# Patient Record
Sex: Male | Born: 1980 | Race: White | Hispanic: No | Marital: Married | Smoking: Former smoker
Health system: Southern US, Community
[De-identification: ages and names within clinical notes are randomized; demographics above are authoritative.]

## PROBLEM LIST (undated history)

## (undated) DIAGNOSIS — K219 Gastro-esophageal reflux disease without esophagitis: Secondary | ICD-10-CM

## (undated) DIAGNOSIS — Z889 Allergy status to unspecified drugs, medicaments and biological substances status: Secondary | ICD-10-CM

## (undated) DIAGNOSIS — M1990 Unspecified osteoarthritis, unspecified site: Secondary | ICD-10-CM

## (undated) DIAGNOSIS — F419 Anxiety disorder, unspecified: Secondary | ICD-10-CM

## (undated) HISTORY — PX: APPENDECTOMY: SHX54

## (undated) HISTORY — PX: VASECTOMY: SHX75

## (undated) HISTORY — PX: TONSILLECTOMY: SUR1361

## (undated) HISTORY — PX: WISDOM TOOTH EXTRACTION: SHX21

---

## 2011-09-27 ENCOUNTER — Ambulatory Visit (INDEPENDENT_AMBULATORY_CARE_PROVIDER_SITE_OTHER): Payer: BC Managed Care – PPO | Admitting: Urology

## 2011-09-27 DIAGNOSIS — Z3009 Encounter for other general counseling and advice on contraception: Secondary | ICD-10-CM

## 2015-09-09 ENCOUNTER — Encounter (HOSPITAL_COMMUNITY): Payer: Self-pay

## 2015-09-09 ENCOUNTER — Encounter (HOSPITAL_COMMUNITY)
Admission: RE | Admit: 2015-09-09 | Discharge: 2015-09-09 | Disposition: A | Discharge status: Home / Self Care | Payer: BLUE CROSS/BLUE SHIELD | Source: Ambulatory Visit | Attending: Orthopedic Surgery | Admitting: Orthopedic Surgery

## 2015-09-09 DIAGNOSIS — Z0183 Encounter for blood typing: Secondary | ICD-10-CM | POA: Diagnosis not present

## 2015-09-09 DIAGNOSIS — M1611 Unilateral primary osteoarthritis, right hip: Secondary | ICD-10-CM | POA: Diagnosis not present

## 2015-09-09 DIAGNOSIS — Z01812 Encounter for preprocedural laboratory examination: Secondary | ICD-10-CM | POA: Diagnosis present

## 2015-09-09 HISTORY — DX: Rider (driver) (passenger) of other motorcycle injured in unspecified traffic accident, initial encounter: V29.99XA

## 2015-09-09 HISTORY — DX: Unspecified osteoarthritis, unspecified site: M19.90

## 2015-09-09 HISTORY — DX: Allergy status to unspecified drugs, medicaments and biological substances: Z88.9

## 2015-09-09 HISTORY — DX: Gastro-esophageal reflux disease without esophagitis: K21.9

## 2015-09-09 HISTORY — DX: Anxiety disorder, unspecified: F41.9

## 2015-09-09 LAB — CBC
HEMATOCRIT: 46.8 % (ref 39.0–52.0)
HEMOGLOBIN: 15.5 g/dL (ref 13.0–17.0)
MCH: 30.9 pg (ref 26.0–34.0)
MCHC: 33.1 g/dL (ref 30.0–36.0)
MCV: 93.2 fL (ref 78.0–100.0)
Platelets: 183 10*3/uL (ref 150–400)
RBC: 5.02 MIL/uL (ref 4.22–5.81)
RDW: 13.1 % (ref 11.5–15.5)
WBC: 7.3 10*3/uL (ref 4.0–10.5)

## 2015-09-09 LAB — URINALYSIS, ROUTINE W REFLEX MICROSCOPIC
BILIRUBIN URINE: NEGATIVE
GLUCOSE, UA: NEGATIVE mg/dL
Hgb urine dipstick: NEGATIVE
KETONES UR: NEGATIVE mg/dL
LEUKOCYTES UA: NEGATIVE
Nitrite: NEGATIVE
PH: 7 (ref 5.0–8.0)
PROTEIN: NEGATIVE mg/dL
Specific Gravity, Urine: 1.014 (ref 1.005–1.030)

## 2015-09-09 LAB — BASIC METABOLIC PANEL
ANION GAP: 10 (ref 5–15)
BUN: 16 mg/dL (ref 6–20)
CALCIUM: 9.3 mg/dL (ref 8.9–10.3)
CO2: 25 mmol/L (ref 22–32)
Chloride: 105 mmol/L (ref 101–111)
Creatinine, Ser: 0.89 mg/dL (ref 0.61–1.24)
GFR calc non Af Amer: >60 mL/min (ref >60)
GLUCOSE: 105 mg/dL — AB (ref 65–99)
POTASSIUM: 4 mmol/L (ref 3.5–5.1)
Sodium: 140 mmol/L (ref 135–145)

## 2015-09-09 LAB — ABO/RH: ABO/RH(D): A POS

## 2015-09-09 LAB — PROTIME-INR
INR: 1.02 (ref 0.00–1.49)
PROTHROMBIN TIME: 13.2 s (ref 11.6–15.2)

## 2015-09-09 LAB — SURGICAL PCR SCREEN
MRSA, PCR: NEGATIVE
STAPHYLOCOCCUS AUREUS: NEGATIVE

## 2015-09-09 LAB — APTT: aPTT: 29 seconds (ref 24–37)

## 2015-09-09 NOTE — Pre-Procedure Instructions (Addendum)
Cervical , lumbar spine, right shoulder xrays- 12'16 report with chart.

## 2015-09-09 NOTE — Patient Instructions (Signed)
Kenneth Holt  09/09/2015   Your procedure is scheduled on: 09-15-15  [facility]  [facility] 3rd floor to  Short Stay Center at   11:15 AM.  Call this number if you have problems the morning of surgery [phone]   Remember: ONLY 1 PERSON MAY GO WITH YOU TO SHORT STAY TO GET  READY MORNING OF YOUR SURGERY.  Do not eat food or drink liquids :After Midnight. Exception may have Clear Liquids 12 midnight to 0800 AM, then nothing.     Take these medicines the morning of surgery with A SIP OF WATER: Lexapro.Omeprazole. Acyclovir. Clonazepam-if need. Oxycodone- if need. DO NOT TAKE ANY DIABETIC MEDICATIONS DAY OF YOUR SURGERY                               You may not have any metal on your body including hair pins and              piercings  Do not wear jewelry, make-up, lotions, powders or perfumes, deodorant             Do not wear nail polish.  Do not shave  48 hours prior to surgery.              Men may shave face and neck.   Do not bring valuables to the hospital. [facility] IS NOT             RESPONSIBLE   FOR VALUABLES.  Contacts, dentures or bridgework may not be worn into surgery.  Leave suitcase in the car. After surgery it may be brought to your room.     Patients discharged the day of surgery will not be allowed to drive home.  Name and phone number of your driver:Kenneth Holt -spouse [phone] cell  Special Instructions: N/A              Please read over the following fact sheets you were given: _____________________________________________________________________             [facility] - Pre[facility] Before surgery, you can play an important role.  Because skin is not sterile, your skin needs to be as free of germs as possible.  You can reduce the number of germs on your skin by washing with CHG (chlorahexidine gluconate) soap before surgery.  CHG is an antiseptic cleaner which kills germs and bonds with  the skin to continue killing germs even after washing. Please DO NOT use if you have an allergy to CHG or antibacterial soaps.  If your skin becomes reddened/irritated stop using the CHG and inform your nurse when you arrive at Short Stay. Do not shave (including legs and underarms) for at least 48 hours prior to the first CHG shower.  You may shave your face/neck. Please follow these instructions carefully:  1.  Shower with CHG Soap the night before surgery and the  morning of Surgery.  2.  If you choose to wash your hair, wash your hair first as usual with your  normal  shampoo.  3.  After you shampoo, rinse your hair and body thoroughly to remove the  shampoo.                           4.  Use  CHG as you would any other liquid soap.  You can apply chg directly  to the skin and wash                       Gently with a scrungie or clean washcloth.  5.  Apply the CHG Soap to your body ONLY FROM THE NECK DOWN.   Do not use on face/ open                           Wound or open sores. Avoid contact with eyes, ears mouth and genitals (private parts).                       Wash face,  Genitals (private parts) with your normal soap.             6.  Wash thoroughly, paying special attention to the area where your surgery  will be performed.  7.  Thoroughly rinse your body with warm water from the neck down.  8.  DO NOT shower/wash with your normal soap after using and rinsing off  the CHG Soap.                9.  Pat yourself dry with a clean towel.            10.  Wear clean pajamas.            11.  Place clean sheets on your bed the night of your first shower and do not  sleep with pets. Day of Surgery : Do not apply any lotions/deodorants the morning of surgery.  Please wear clean clothes to the hospital/surgery center.  FAILURE TO FOLLOW THESE INSTRUCTIONS MAY RESULT IN THE CANCELLATION OF YOUR SURGERY PATIENT SIGNATURE_________________________________  NURSE  SIGNATURE__________________________________  ________________________________________________________________________   Kenneth Holt  An incentive spirometer is a tool that can help keep your lungs clear and active. This tool measures how well you are filling your lungs with each breath. Taking long deep breaths may help reverse or decrease the chance of developing breathing (pulmonary) problems (especially infection) following:  A long period of time when you are unable to move or be active. BEFORE THE PROCEDURE   If the spirometer includes an indicator to show your best effort, your nurse or respiratory therapist will set it to a desired goal.  If possible, sit up straight or lean slightly forward. Try not to slouch.  Hold the incentive spirometer in an upright position. INSTRUCTIONS FOR USE   Sit on the edge of your bed if possible, or sit up as far as you can in bed or on a chair.  Hold the incentive spirometer in an upright position.  Breathe out normally.  Place the mouthpiece in your mouth and seal your lips tightly around it.  Breathe in slowly and as deeply as possible, raising the piston or the ball toward the top of the column.  Hold your breath for 3-5 seconds or for as long as possible. Allow the piston or ball to fall to the bottom of the column.  Remove the mouthpiece from your mouth and breathe out normally.  Rest for a few seconds and repeat Steps 1 through 7 at least 10 times every 1-2 hours when you are awake. Take your time and take a few normal breaths between deep breaths.  The spirometer may include an indicator to show your best effort.  Use the indicator as a goal to work toward during each repetition.  After each set of 10 deep breaths, practice coughing to be sure your lungs are clear. If you have an incision (the cut made at the time of surgery), support your incision when coughing by placing a pillow or rolled up towels firmly against it. Once  you are able to get out of bed, walk around indoors and cough well. You may stop using the incentive spirometer when instructed by your caregiver.  RISKS AND COMPLICATIONS  Take your time so you do not get dizzy or light-headed.  If you are in pain, you may need to take or ask for pain medication before doing incentive spirometry. It is harder to take a deep breath if you are having pain. AFTER USE  Rest and breathe slowly and easily.  It can be helpful to keep track of a log of your progress. Your caregiver can provide you with a simple table to help with this. If you are using the spirometer at home, follow these instructions: [place] IF:   You are having difficultly using the spirometer.  You have trouble using the spirometer as often as instructed.  Your pain medication is not giving enough relief while using the spirometer.  You develop fever of 100.5 F (38.1 C) or higher. SEEK IMMEDIATE MEDICAL CARE IF:   You cough up bloody sputum that had not been present before.  You develop fever of 102 F (38.9 C) or greater.  You develop worsening pain at or near the incision site. MAKE SURE YOU:   Understand these instructions.  Will watch your condition.  Will get help right away if you are not doing well or get worse. Document Released: 10/31/2006 Document Revised: 09/12/2011 Document Reviewed: 01/01/2007 ExitCare Patient Information 2014 Exit[employer], [place].   ________________________________________________________________________  WHAT IS A BLOOD TRANSFUSION? Blood Transfusion Information  A transfusion is the replacement of blood or some of its parts. Blood is made up of multiple cells which provide different functions.  Red blood cells carry oxygen and are used for blood loss replacement.  White blood cells fight against infection.  Platelets control bleeding.  Plasma helps clot blood.  Other blood products are available for specialized needs, such as  hemophilia or other clotting disorders. BEFORE THE TRANSFUSION  Who gives blood for transfusions?   Healthy volunteers who are fully evaluated to make sure their blood is safe. This is blood bank blood. Transfusion therapy is the safest it has ever been in the practice of medicine. Before blood is taken from a donor, a complete history is taken to make sure that person has no history of diseases nor engages in risky social behavior (examples are intravenous drug use or sexual activity with multiple partners). The donor's travel history is screened to minimize risk of transmitting infections, such as malaria. The donated blood is tested for signs of infectious diseases, such as HIV and hepatitis. The blood is then tested to be sure it is compatible with you in order to minimize the chance of a transfusion reaction. If you or a relative donates blood, this is often done in anticipation of surgery and is not appropriate for emergency situations. It takes many days to process the donated blood. RISKS AND COMPLICATIONS Although transfusion therapy is very safe and saves many lives, the main dangers of transfusion include:   Getting an infectious disease.  Developing a transfusion reaction. This is an allergic reaction to something in the  blood you were given. Every precaution is taken to prevent this. The decision to have a blood transfusion has been considered carefully by your caregiver before blood is given. Blood is not given unless the benefits outweigh the risks. AFTER THE TRANSFUSION  Right after receiving a blood transfusion, you will usually feel much better and more energetic. This is especially true if your red blood cells have gotten low (anemic). The transfusion raises the level of the red blood cells which carry oxygen, and this usually causes an energy increase.  The nurse administering the transfusion will monitor you carefully for complications. HOME CARE INSTRUCTIONS  No special  instructions are needed after a transfusion. You may find your energy is better. Speak with your caregiver about any limitations on activity for underlying diseases you may have. SEEK MEDICAL CARE IF:   Your condition is not improving after your transfusion.  You develop redness or irritation at the intravenous (IV) site. SEEK IMMEDIATE MEDICAL CARE IF:  Any of the following symptoms occur over the next 12 hours:  Shaking chills.  You have a temperature by mouth above 102 F (38.9 C), not controlled by medicine.  Chest, back, or muscle pain.  People around you feel you are not acting correctly or are confused.  Shortness of breath or difficulty breathing.  Dizziness and fainting.  You get a rash or develop hives.  You have a decrease in urine output.  Your urine turns a dark color or changes to pink, red, or brown. Any of the following symptoms occur over the next 10 days:  You have a temperature by mouth above 102 F (38.9 C), not controlled by medicine.  Shortness of breath.  Weakness after normal activity.  The white part of the eye turns yellow (jaundice).  You have a decrease in the amount of urine or are urinating less often.  Your urine turns a dark color or changes to pink, red, or brown. Document Released: 06/17/2000 Document Revised: 09/12/2011 Document Reviewed: 02/04/2008 [facility] Patient Information 2014 [place], [place].  _______________________________________________________________________
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
## 2015-09-15 ENCOUNTER — Inpatient Hospital Stay (HOSPITAL_COMMUNITY): Payer: BLUE CROSS/BLUE SHIELD

## 2015-09-15 ENCOUNTER — Encounter (HOSPITAL_COMMUNITY): Payer: Self-pay | Admitting: *Deleted

## 2015-09-15 ENCOUNTER — Inpatient Hospital Stay (HOSPITAL_COMMUNITY): Payer: BLUE CROSS/BLUE SHIELD | Admitting: Registered Nurse

## 2015-09-15 ENCOUNTER — Inpatient Hospital Stay (HOSPITAL_COMMUNITY)
Admission: RE | Admit: 2015-09-15 | Discharge: 2015-09-16 | DRG: 470 | Disposition: A | Discharge status: Home Health | Payer: BLUE CROSS/BLUE SHIELD | Source: Ambulatory Visit | Attending: Orthopedic Surgery | Admitting: Orthopedic Surgery

## 2015-09-15 ENCOUNTER — Encounter (HOSPITAL_COMMUNITY)
Admission: RE | Disposition: A | Discharge status: Home Health | Payer: Self-pay | Source: Ambulatory Visit | Attending: Orthopedic Surgery

## 2015-09-15 DIAGNOSIS — K219 Gastro-esophageal reflux disease without esophagitis: Secondary | ICD-10-CM | POA: Diagnosis present

## 2015-09-15 DIAGNOSIS — Z01812 Encounter for preprocedural laboratory examination: Secondary | ICD-10-CM | POA: Diagnosis not present

## 2015-09-15 DIAGNOSIS — M25551 Pain in right hip: Secondary | ICD-10-CM | POA: Diagnosis present

## 2015-09-15 DIAGNOSIS — Z96649 Presence of unspecified artificial hip joint: Secondary | ICD-10-CM

## 2015-09-15 DIAGNOSIS — Z6829 Body mass index (BMI) 29.0-29.9, adult: Secondary | ICD-10-CM

## 2015-09-15 DIAGNOSIS — M1611 Unilateral primary osteoarthritis, right hip: Principal | ICD-10-CM | POA: Diagnosis present

## 2015-09-15 DIAGNOSIS — E663 Overweight: Secondary | ICD-10-CM | POA: Diagnosis present

## 2015-09-15 DIAGNOSIS — Z87891 Personal history of nicotine dependence: Secondary | ICD-10-CM

## 2015-09-15 HISTORY — PX: TOTAL HIP ARTHROPLASTY: SHX124

## 2015-09-15 LAB — TYPE AND SCREEN
ABO/RH(D): A POS
ANTIBODY SCREEN: NEGATIVE

## 2015-09-15 IMAGING — DX DG HIP (WITH OR WITHOUT PELVIS) 1V PORT*R*
3 series · 3 of 3 positions shown · non-contrast
Comparison: None.

CLINICAL DATA: Status post right total hip replacement.

EXAM:
DG HIP (WITH OR WITHOUT PELVIS) 1V PORT RIGHT

[pelvis ap (1 of 2)]
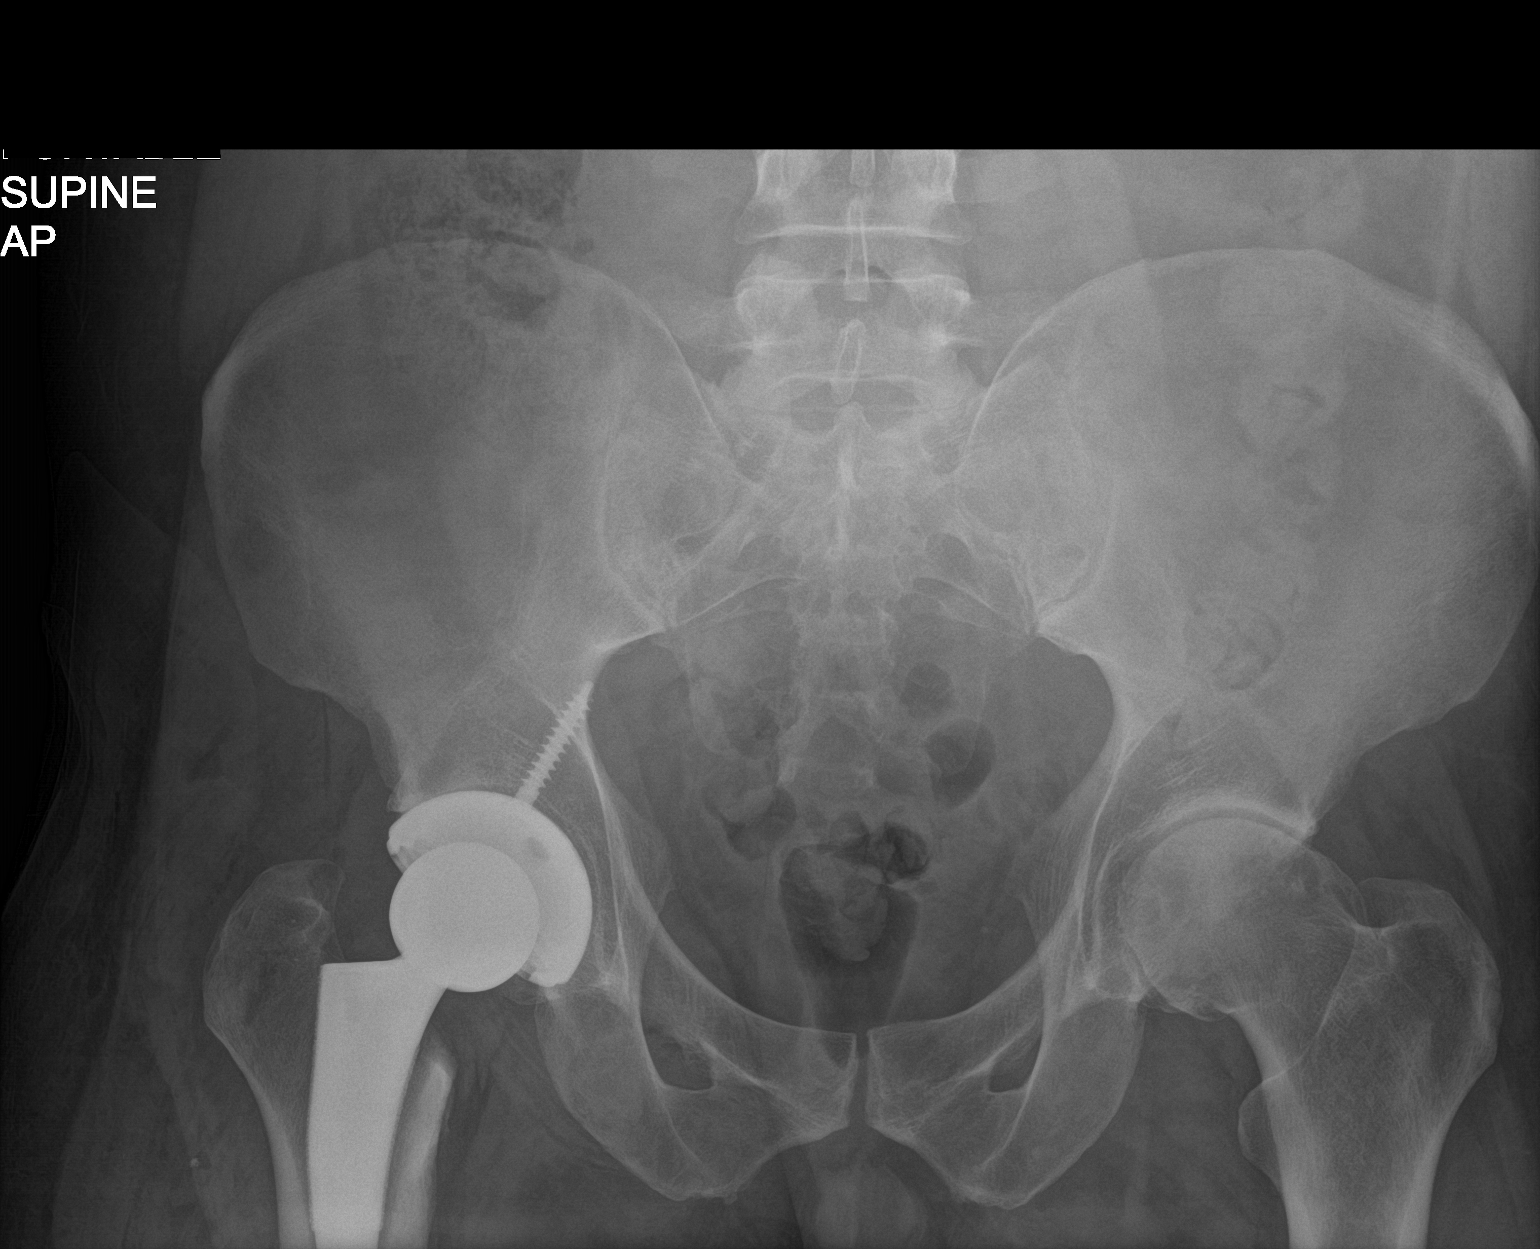

[hip lat]
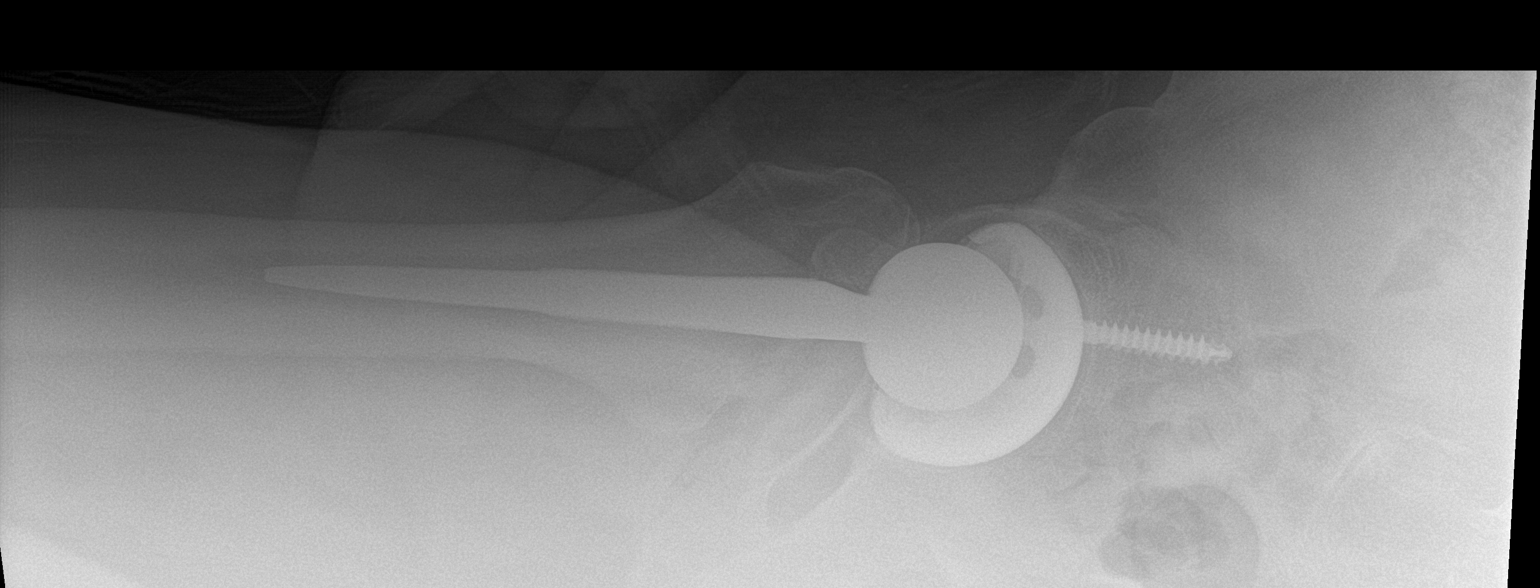

[pelvis ap (2 of 2)]
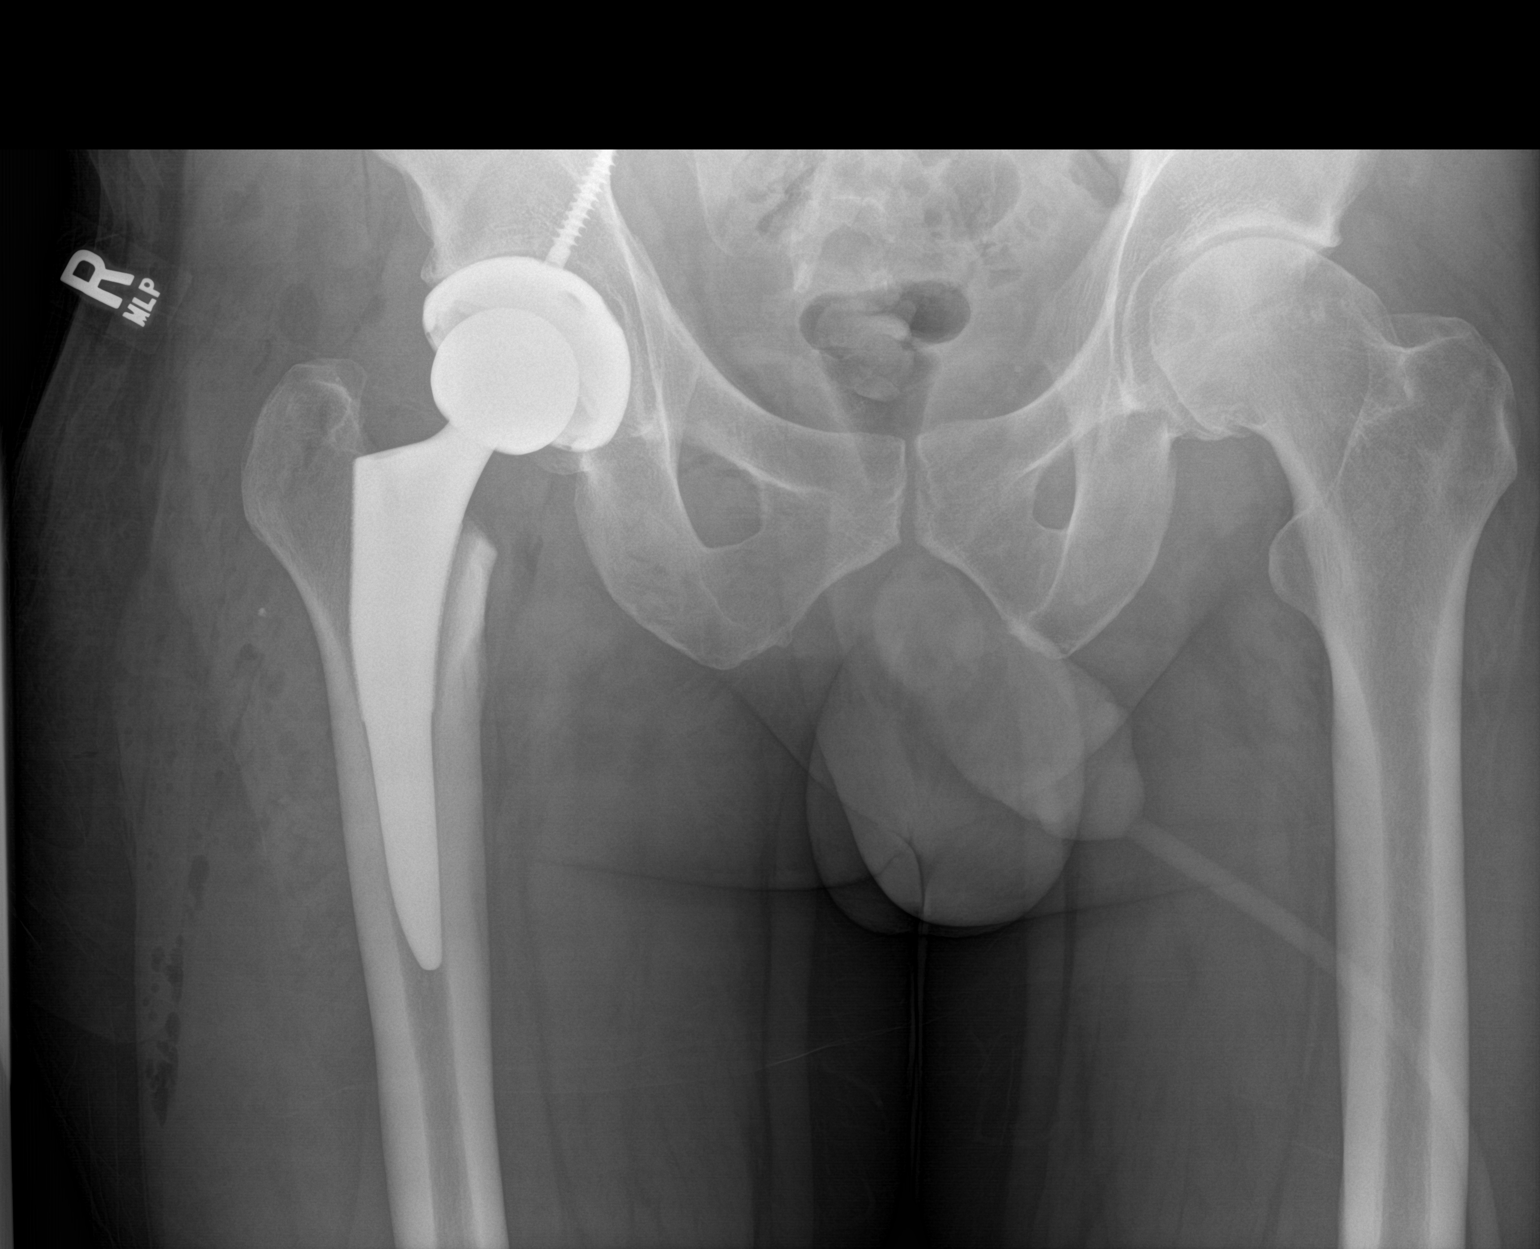

[3 of 3 positions shown; findings below may reference images not displayed]

FINDINGS: Status post right total hip arthroplasty. The femoral and acetabular
components appear to be well situated. No fracture or dislocation is
noted. Moderate to severe degenerative changes seen involving the
left hip joint. Expected postoperative changes are seen in the
surrounding soft tissues of the right hip.
IMPRESSION: Status post right total hip arthroplasty. These results will be
called to the ordering clinician or representative by the
Radiologist Assistant, and communication documented in the PACS or
[facility] Dashboard.

## 2015-09-15 SURGERY — ARTHROPLASTY, HIP, TOTAL, ANTERIOR APPROACH
Anesthesia: Spinal | Site: Hip | Laterality: Right

## 2015-09-15 MED ORDER — ONDANSETRON HCL 4 MG PO TABS: 4.0000 mg | ORAL_TABLET | Freq: Four times a day (QID) | ORAL | Status: DC | PRN

## 2015-09-15 MED ORDER — FENTANYL CITRATE (PF) 100 MCG/2ML IJ SOLN
INTRAMUSCULAR | Status: AC
  Filled 2015-09-15: qty 2

## 2015-09-15 MED ORDER — ALUM & MAG HYDROXIDE-SIMETH 200-200-20 MG/5ML PO SUSP: 30.0000 mL | ORAL | Status: DC | PRN

## 2015-09-15 MED ORDER — ACYCLOVIR 400 MG PO TABS
400.0000 mg | ORAL_TABLET | Freq: Every day | ORAL | Status: DC
  Administered 2015-09-16: 400 mg via ORAL
  Filled 2015-09-15: qty 1

## 2015-09-15 MED ORDER — NON FORMULARY
40.0000 mg | Freq: Every day | Status: DC
Start: 2015-09-16 — End: 2015-09-15

## 2015-09-15 MED ORDER — PROPOFOL 10 MG/ML IV BOLUS
INTRAVENOUS | Status: DC | PRN
  Administered 2015-09-15 (×4): 20 mg via INTRAVENOUS

## 2015-09-15 MED ORDER — DOCUSATE SODIUM 100 MG PO CAPS
100.0000 mg | ORAL_CAPSULE | Freq: Two times a day (BID) | ORAL | Status: DC
  Administered 2015-09-15 – 2015-09-16 (×2): 100 mg via ORAL

## 2015-09-15 MED ORDER — TRANEXAMIC ACID 1000 MG/10ML IV SOLN
1000.0000 mg | Freq: Once | INTRAVENOUS | Status: AC
  Administered 2015-09-15: 1000 mg via INTRAVENOUS
  Filled 2015-09-15: qty 10

## 2015-09-15 MED ORDER — PHENOL 1.4 % MT LIQD
1.0000 | OROMUCOSAL | Status: DC | PRN
Start: 2015-09-15 — End: 2015-09-16

## 2015-09-15 MED ORDER — BISACODYL 10 MG RE SUPP
10.0000 mg | Freq: Every day | RECTAL | Status: DC | PRN
Start: 2015-09-15 — End: 2015-09-16

## 2015-09-15 MED ORDER — LACTATED RINGERS IV SOLN
INTRAVENOUS | Status: DC
  Administered 2015-09-15: 1000 mL via INTRAVENOUS
  Administered 2015-09-15 (×2): via INTRAVENOUS

## 2015-09-15 MED ORDER — PROPOFOL 10 MG/ML IV BOLUS
INTRAVENOUS | Status: AC
  Filled 2015-09-15: qty 40

## 2015-09-15 MED ORDER — FERROUS SULFATE 325 (65 FE) MG PO TABS
325.0000 mg | ORAL_TABLET | Freq: Three times a day (TID) | ORAL | Status: DC
  Administered 2015-09-16: 325 mg via ORAL
  Filled 2015-09-15 (×5): qty 1

## 2015-09-15 MED ORDER — PROMETHAZINE HCL 25 MG/ML IJ SOLN: 6.2500 mg | INTRAMUSCULAR | Status: DC | PRN

## 2015-09-15 MED ORDER — BUPIVACAINE IN DEXTROSE 0.75-8.25 % IT SOLN
INTRATHECAL | Status: DC | PRN
  Administered 2015-09-15: 2 mL via INTRATHECAL

## 2015-09-15 MED ORDER — ONDANSETRON HCL 4 MG/2ML IJ SOLN
INTRAMUSCULAR | Status: AC
  Filled 2015-09-15: qty 2

## 2015-09-15 MED ORDER — HYDROMORPHONE HCL 1 MG/ML IJ SOLN
0.5000 mg | INTRAMUSCULAR | Status: DC | PRN
  Administered 2015-09-15 – 2015-09-16 (×2): 1 mg via INTRAVENOUS
  Filled 2015-09-15 (×2): qty 1

## 2015-09-15 MED ORDER — PROPOFOL 10 MG/ML IV BOLUS
INTRAVENOUS | Status: AC
  Filled 2015-09-15: qty 20

## 2015-09-15 MED ORDER — CEFAZOLIN SODIUM-DEXTROSE 2-3 GM-% IV SOLR
INTRAVENOUS | Status: AC
  Filled 2015-09-15: qty 50

## 2015-09-15 MED ORDER — SODIUM CHLORIDE 0.9 % IR SOLN
Status: DC | PRN
  Administered 2015-09-15: 1000 mL

## 2015-09-15 MED ORDER — DEXAMETHASONE SODIUM PHOSPHATE 10 MG/ML IJ SOLN
INTRAMUSCULAR | Status: AC
  Filled 2015-09-15: qty 1

## 2015-09-15 MED ORDER — DEXAMETHASONE SODIUM PHOSPHATE 10 MG/ML IJ SOLN
10.0000 mg | Freq: Once | INTRAMUSCULAR | Status: AC
  Administered 2015-09-15: 10 mg via INTRAVENOUS

## 2015-09-15 MED ORDER — ONDANSETRON HCL 4 MG/2ML IJ SOLN
4.0000 mg | Freq: Four times a day (QID) | INTRAMUSCULAR | Status: DC | PRN
  Administered 2015-09-16: 4 mg via INTRAVENOUS
  Filled 2015-09-15: qty 2

## 2015-09-15 MED ORDER — CEFAZOLIN SODIUM-DEXTROSE 2-3 GM-% IV SOLR
2.0000 g | Freq: Four times a day (QID) | INTRAVENOUS | Status: AC
  Administered 2015-09-15 – 2015-09-16 (×2): 2 g via INTRAVENOUS
  Filled 2015-09-15 (×2): qty 50

## 2015-09-15 MED ORDER — SODIUM CHLORIDE 0.9 % IV SOLN
100.0000 mL/h | INTRAVENOUS | Status: DC
  Administered 2015-09-15: 100 mL/h via INTRAVENOUS
  Filled 2015-09-15 (×4): qty 1000

## 2015-09-15 MED ORDER — METHOCARBAMOL 1000 MG/10ML IJ SOLN
500.0000 mg | Freq: Four times a day (QID) | INTRAMUSCULAR | Status: DC | PRN
  Administered 2015-09-15: 500 mg via INTRAVENOUS
  Filled 2015-09-15 (×2): qty 5

## 2015-09-15 MED ORDER — METOCLOPRAMIDE HCL 5 MG/ML IJ SOLN
5.0000 mg | Freq: Three times a day (TID) | INTRAMUSCULAR | Status: DC | PRN
  Administered 2015-09-15: 10 mg via INTRAVENOUS
  Filled 2015-09-15: qty 2

## 2015-09-15 MED ORDER — DIPHENHYDRAMINE HCL 25 MG PO CAPS: 25.0000 mg | ORAL_CAPSULE | Freq: Four times a day (QID) | ORAL | Status: DC | PRN

## 2015-09-15 MED ORDER — MIDAZOLAM HCL 5 MG/5ML IJ SOLN
INTRAMUSCULAR | Status: DC | PRN
  Administered 2015-09-15: 2 mg via INTRAVENOUS

## 2015-09-15 MED ORDER — MAGNESIUM CITRATE PO SOLN: 1.0000 | Freq: Once | ORAL | Status: DC | PRN

## 2015-09-15 MED ORDER — HYDROCODONE-ACETAMINOPHEN 7.5-325 MG PO TABS
1.0000 | ORAL_TABLET | ORAL | Status: DC
  Administered 2015-09-15 – 2015-09-16 (×4): 2 via ORAL
  Filled 2015-09-15: qty 1
  Filled 2015-09-15 (×4): qty 2

## 2015-09-15 MED ORDER — STERILE WATER FOR IRRIGATION IR SOLN
Status: DC | PRN
  Administered 2015-09-15: 1000 mL

## 2015-09-15 MED ORDER — PROPOFOL 10 MG/ML IV BOLUS: INTRAVENOUS | Status: DC | PRN

## 2015-09-15 MED ORDER — PROPOFOL 10 MG/ML IV BOLUS
INTRAVENOUS | Status: AC
  Filled 2015-09-15: qty 60

## 2015-09-15 MED ORDER — MIDAZOLAM HCL 2 MG/2ML IJ SOLN
INTRAMUSCULAR | Status: AC
  Filled 2015-09-15: qty 2

## 2015-09-15 MED ORDER — METOCLOPRAMIDE HCL 10 MG PO TABS: 5.0000 mg | ORAL_TABLET | Freq: Three times a day (TID) | ORAL | Status: DC | PRN

## 2015-09-15 MED ORDER — CEFAZOLIN SODIUM-DEXTROSE 2-3 GM-% IV SOLR
2.0000 g | INTRAVENOUS | Status: AC
  Administered 2015-09-15: 2 g via INTRAVENOUS

## 2015-09-15 MED ORDER — CLONAZEPAM 0.5 MG PO TABS: 0.5000 mg | ORAL_TABLET | Freq: Two times a day (BID) | ORAL | Status: DC | PRN

## 2015-09-15 MED ORDER — DEXAMETHASONE SODIUM PHOSPHATE 10 MG/ML IJ SOLN
10.0000 mg | Freq: Once | INTRAMUSCULAR | Status: AC
  Administered 2015-09-16: 10 mg via INTRAVENOUS
  Filled 2015-09-15: qty 1

## 2015-09-15 MED ORDER — CHLORHEXIDINE GLUCONATE 4 % EX LIQD: 60.0000 mL | Freq: Once | CUTANEOUS | Status: DC

## 2015-09-15 MED ORDER — CELECOXIB 200 MG PO CAPS
200.0000 mg | ORAL_CAPSULE | Freq: Two times a day (BID) | ORAL | Status: DC
  Administered 2015-09-15 – 2015-09-16 (×2): 200 mg via ORAL
  Filled 2015-09-15 (×3): qty 1

## 2015-09-15 MED ORDER — ASPIRIN EC 325 MG PO TBEC
325.0000 mg | DELAYED_RELEASE_TABLET | Freq: Two times a day (BID) | ORAL | Status: DC
  Administered 2015-09-16: 325 mg via ORAL
  Filled 2015-09-15 (×3): qty 1

## 2015-09-15 MED ORDER — PROPOFOL 10 MG/ML IV BOLUS
INTRAVENOUS | Status: DC | PRN
  Administered 2015-09-15: 100 ug/kg/min via INTRAVENOUS

## 2015-09-15 MED ORDER — ESCITALOPRAM OXALATE 20 MG PO TABS
20.0000 mg | ORAL_TABLET | Freq: Every day | ORAL | Status: DC
  Administered 2015-09-16: 20 mg via ORAL
  Filled 2015-09-15: qty 1

## 2015-09-15 MED ORDER — MEPERIDINE HCL 50 MG/ML IJ SOLN: 6.2500 mg | INTRAMUSCULAR | Status: DC | PRN

## 2015-09-15 MED ORDER — ONDANSETRON HCL 4 MG/2ML IJ SOLN
INTRAMUSCULAR | Status: DC | PRN
Start: 2015-09-15 — End: 2015-09-15
  Administered 2015-09-15: 4 mg via INTRAVENOUS

## 2015-09-15 MED ORDER — MENTHOL 3 MG MT LOZG: 1.0000 | LOZENGE | OROMUCOSAL | Status: DC | PRN

## 2015-09-15 MED ORDER — METHOCARBAMOL 500 MG PO TABS
500.0000 mg | ORAL_TABLET | Freq: Four times a day (QID) | ORAL | Status: DC | PRN
  Administered 2015-09-16 (×2): 500 mg via ORAL
  Filled 2015-09-15 (×2): qty 1

## 2015-09-15 MED ORDER — POLYETHYLENE GLYCOL 3350 17 G PO PACK
17.0000 g | PACK | Freq: Two times a day (BID) | ORAL | Status: DC
  Administered 2015-09-15 – 2015-09-16 (×2): 17 g via ORAL

## 2015-09-15 MED ORDER — FENTANYL CITRATE (PF) 100 MCG/2ML IJ SOLN
INTRAMUSCULAR | Status: DC | PRN
  Administered 2015-09-15: 100 ug via INTRAVENOUS

## 2015-09-15 MED ORDER — LIDOCAINE HCL (CARDIAC) 20 MG/ML IV SOLN
INTRAVENOUS | Status: AC
  Filled 2015-09-15: qty 5

## 2015-09-15 MED ORDER — OMEPRAZOLE 20 MG PO CPDR
40.0000 mg | DELAYED_RELEASE_CAPSULE | Freq: Every day | ORAL | Status: DC
  Administered 2015-09-16: 40 mg via ORAL
  Filled 2015-09-15: qty 2

## 2015-09-15 MED ORDER — FENTANYL CITRATE (PF) 100 MCG/2ML IJ SOLN
25.0000 ug | INTRAMUSCULAR | Status: DC | PRN
  Administered 2015-09-15: 50 ug via INTRAVENOUS

## 2015-09-15 MED ORDER — LACTATED RINGERS IV SOLN
INTRAVENOUS | Status: DC
  Administered 2015-09-15: 17:00:00 via INTRAVENOUS

## 2015-09-15 SURGICAL SUPPLY — 34 items
BAG ZIPLOCK 12X15 (MISCELLANEOUS) IMPLANT
CAPT HIP TOTAL 2 ×2 IMPLANT
CLOTH BEACON ORANGE TIMEOUT ST (SAFETY) ×2 IMPLANT
COVER PERINEAL POST (MISCELLANEOUS) ×2 IMPLANT
CUP ACET PINNACLE SECTR 56MM (Hips) IMPLANT
DRAPE STERI IOBAN 125X83 (DRAPES) ×2 IMPLANT
DRAPE U-SHAPE 47X51 STRL (DRAPES) ×4 IMPLANT
DRSG AQUACEL AG ADV 3.5X10 (GAUZE/BANDAGES/DRESSINGS) ×2 IMPLANT
DURAPREP 26ML APPLICATOR (WOUND CARE) ×2 IMPLANT
ELECT REM PT RETURN 9FT ADLT (ELECTROSURGICAL) ×2
ELECTRODE REM PT RTRN 9FT ADLT (ELECTROSURGICAL) ×1 IMPLANT
GLOVE BIO SURGEON STRL SZ7.5 (GLOVE) ×2 IMPLANT
GLOVE BIOGEL M STRL SZ7.5 (GLOVE) ×2 IMPLANT
GLOVE BIOGEL PI IND STRL 7.5 (GLOVE) ×5 IMPLANT
GLOVE BIOGEL PI IND STRL 8.5 (GLOVE) ×1 IMPLANT
GLOVE BIOGEL PI INDICATOR 7.5 (GLOVE) ×5
GLOVE BIOGEL PI INDICATOR 8.5 (GLOVE) ×1
GLOVE ECLIPSE 8.0 STRL XLNG CF (GLOVE) ×4 IMPLANT
GLOVE ORTHO TXT STRL SZ7.5 (GLOVE) ×2 IMPLANT
GLOVE SURG SS PI 7.5 STRL IVOR (GLOVE) ×2 IMPLANT
GOWN STRL REUS W/TWL LRG LVL3 (GOWN DISPOSABLE) ×2 IMPLANT
GOWN STRL REUS W/TWL XL LVL3 (GOWN DISPOSABLE) ×8 IMPLANT
HOLDER FOLEY CATH W/STRAP (MISCELLANEOUS) ×2 IMPLANT
LIQUID BAND (GAUZE/BANDAGES/DRESSINGS) ×2 IMPLANT
PACK ANTERIOR HIP CUSTOM (KITS) ×2 IMPLANT
PINNACLE SECTOR CUP 56MM (Hips) IMPLANT
SAW OSC TIP CART 19.5X105X1.3 (SAW) ×2 IMPLANT
SUT MNCRL AB 4-0 PS2 18 (SUTURE) ×2 IMPLANT
SUT VIC AB 1 CT1 36 (SUTURE) ×6 IMPLANT
SUT VIC AB 2-0 CT1 27 (SUTURE) ×2
SUT VIC AB 2-0 CT1 TAPERPNT 27 (SUTURE) ×2 IMPLANT
SUT VLOC 180 0 24IN GS25 (SUTURE) ×2 IMPLANT
TRAY FOLEY W/METER SILVER 16FR (SET/KITS/TRAYS/PACK) ×2 IMPLANT
YANKAUER SUCT BULB TIP 10FT TU (MISCELLANEOUS) ×2 IMPLANT

## 2015-09-15 NOTE — Discharge Instructions (Signed)

## 2015-09-15 NOTE — Transfer of Care (Signed)
Immediate Anesthesia Transfer of Care Note  Patient: Kenneth Holt  Procedure(s) Performed: Procedure(s): RIGHT TOTAL HIP ARTHROPLASTY ANTERIOR APPROACH (Right)  Patient Location: PACU  Anesthesia Type:Spinal  Level of Consciousness: awake, alert  and oriented  Airway & Oxygen Therapy: Patient Spontanous Breathing and Patient connected to face mask oxygen  Post-op Assessment: Report given to RN and Post -op Vital signs reviewed and stable  Post vital signs: Reviewed and stable  Last Vitals: There were no vitals filed for this visit.  Complications: No apparent anesthesia complications

## 2015-09-15 NOTE — Anesthesia Postprocedure Evaluation (Signed)
Anesthesia Post Note  Patient: Kenneth Holt  Procedure(s) Performed: Procedure(s) (LRB): RIGHT TOTAL HIP ARTHROPLASTY ANTERIOR APPROACH (Right)  Patient location during evaluation: PACU Anesthesia Type: Spinal Level of consciousness: oriented and awake and alert Pain management: pain level controlled Vital Signs Assessment: post-procedure vital signs reviewed and stable Respiratory status: spontaneous breathing, respiratory function stable and patient connected to nasal cannula oxygen Cardiovascular status: blood pressure returned to baseline and stable Postop Assessment: no headache, no backache and spinal receding Anesthetic complications: no    Last Vitals:  Filed Vitals:   09/15/15 1645 09/15/15 1745  BP: 115/71 109/66  Pulse: 74 81  Temp: 36.6 C 36.4 C  Resp: 9 15    Last Pain:  Filed Vitals:   09/15/15 1804  PainSc: 8                  Shelton SilvasKevin D Cirilo Canner

## 2015-09-15 NOTE — Op Note (Signed)
NAME:  Kenneth Holt                ACCOUNT NO.: [id number]      MEDICAL RECORD NO.: [id number]      FACILITY:  [facility]      PHYSICIAN:  Durene Romans D  DATE OF BIRTH:  01-08-1981     DATE OF PROCEDURE:  09/15/2015                                 OPERATIVE REPORT         PREOPERATIVE DIAGNOSIS: Right  hip osteoarthritis.      POSTOPERATIVE DIAGNOSIS:  Right hip osteoarthritis.      PROCEDURE:  Right total hip replacement through an anterior approach   utilizing DePuy THR system, component size 56mm pinnacle cup, a size 36+4 neutral   Altrex liner, a size 6 Hi Tri Lock stem with a 36+1.5 delta ceramic   ball.      SURGEON:  Madlyn Frankel. Charlann Boxer, M.D.      ASSISTANT:  Skip Mayer, PA-C     ANESTHESIA:  Spinal.      SPECIMENS:  None.      COMPLICATIONS:  None.      BLOOD LOSS:  1100 cc     DRAINS:  None      INDICATION OF THE PROCEDURE:  Kenneth Holt is a 35 y.o. male who had   presented to office for evaluation of bilateral hip pain.  Radiographs revealed   progressive degenerative changes with bone-on-bone   articulation to the  hip joint.  The patient had painful limited range of   motion significantly affecting their overall quality of life.  The patient was failing to    respond to conservative measures, and at this point was ready   to proceed with more definitive measures.  The patient has noted progressive   degenerative changes in his hip, progressive problems and dysfunction   with regarding the hip prior to surgery.  Consent was obtained for   benefit of pain relief.  Specific risk of infection, DVT, component   failure, dislocation, need for revision surgery, as well discussion of   the anterior versus posterior approach were reviewed.  Consent was   obtained for benefit of anterior pain relief through an anterior   approach.      PROCEDURE IN DETAIL:  The patient was brought to operative theater.   Once adequate anesthesia, preoperative  antibiotics, 2gm of Ancef, 1 gm of Tranexamic Acid, and 10 mg of Decadron administered.   The patient was positioned supine on the OSI Hanna table.  Once adequate   padding of boney process was carried out, we had predraped out the hip, and  used fluoroscopy to confirm orientation of the pelvis and position.      The right hip was then prepped and draped from proximal iliac crest to   mid thigh with shower curtain technique.      Time-out was performed identifying the patient, planned procedure, and   extremity.     An incision was then made 2 cm distal and lateral to the   anterior superior iliac spine extending over the orientation of the   tensor fascia lata muscle and sharp dissection was carried down to the   fascia of the muscle and protractor placed in the soft tissues.      The fascia was then incised.  The muscle belly was identified and swept   laterally and retractor placed along the superior neck.  Following   cauterization of the circumflex vessels and removing some pericapsular   fat, a second cobra retractor was placed on the inferior neck.  A third   retractor was placed on the anterior acetabulum after elevating the   anterior rectus.  A L-capsulotomy was along the line of the   superior neck to the trochanteric fossa, then extended proximally and   distally.  Tag sutures were placed and the retractors were then placed   intracapsular.  We then identified the trochanteric fossa and   orientation of my neck cut, confirmed this radiographically   and then made a neck osteotomy with the femur on traction.  The femoral   head was removed without difficulty or complication.  Traction was let   off and retractors were placed posterior and anterior around the   acetabulum.      The labrum and foveal tissue were debrided.  I began reaming with a 49mm   reamer and reamed up to 55mm reamer with good bony bed preparation and a 56mm   cup was chosen.  The final 56mm Pinnacle cup  was then impacted under fluoroscopy  to confirm the depth of penetration and orientation with respect to   abduction.  A screw was placed followed by the hole eliminator.  The final   36+4 neutral Altrex liner was impacted with good visualized rim fit.  The cup was positioned anatomically within the acetabular portion of the pelvis.      At this point, the femur was rolled at 80 degrees.  Further capsule was   released off the inferior aspect of the femoral neck.  I then   released the superior capsule proximally.  The hook was placed laterally   along the femur and elevated manually and held in position with the bed   hook.  The leg was then extended and adducted with the leg rolled to 100   degrees of external rotation.  Once the proximal femur was fully   exposed, I used a box osteotome to set orientation.  I then began   broaching with the starting chili pepper broach and passed this by hand and then broached up to 6.  With the 6 broach in place I chose a high offset neck and did several trial reductions.  The offset was appropriate, leg lengths   appeared to be equal, confirmed radiographically.   Given these findings, I went ahead and dislocated the hip, repositioned all   retractors and positioned the right hip in the extended and abducted position.  The final 6 Hi Tri Lock stem was   chosen and it was impacted down to the level of neck cut.  Based on this   and the trial reduction, a 36+1.5 delta ceramic ball was chosen and   impacted onto a clean and dry trunnion, and the hip was reduced.  The   hip had been irrigated throughout the case again at this point.  I did   reapproximate the superior capsular leaflet to the anterior leaflet   using #1 Vicryl.  The fascia of the   tensor fascia lata muscle was then reapproximated using #1 Vicryl.  The   remaining wound was closed with 2-0 Vicryl and running 4-0 Monocryl.   The hip was cleaned, dried, and dressed sterilely using Dermabond and    Aquacel dressing.  He was then brought  to recovery room in stable condition tolerating the procedure well.    Skip MayerBlair Roberts, PA-C was present for the entirety of the case involved from   preoperative positioning, perioperative retractor management, general   facilitation of the case, as well as primary wound closure as assistant.            Madlyn FrankelMatthew D. Charlann Boxerlin, M.D.        09/15/2015 3:20 PM

## 2015-09-15 NOTE — Progress Notes (Signed)
Pt having complaints of feeling lightheaded and dizzy after dangling on the side of the bed. Pt broke into a cold sweat but remains alert and oriented. Two norco given one hour prior to pt dangling. Pt placed back in bed BP 96/52, P-74 and pulse ox 95%-RA

## 2015-09-15 NOTE — Anesthesia Procedure Notes (Signed)
Spinal Patient location during procedure: OR Start time: 09/15/2015 1:45 PM End time: 09/15/2015 1:47 PM Staffing Anesthesiologist: Suella Broad D Performed by: anesthesiologist  Preanesthetic Checklist Completed: patient identified, site marked, surgical consent, pre-op evaluation, timeout performed, IV checked, risks and benefits discussed and monitors and equipment checked Spinal Block Patient position: sitting Prep: Betadine Patient monitoring: heart rate, continuous pulse ox, blood pressure and cardiac monitor Approach: midline Location: L4-5 Injection technique: single-shot Needle Needle type: Whitacre and Introducer  Needle gauge: 24 G Needle length: 9 cm Additional Notes Negative paresthesia. Negative blood return. Positive free-flowing CSF. Expiration date of kit checked and confirmed. Patient tolerated procedure well, without complications.

## 2015-09-15 NOTE — Interval H&P Note (Signed)
History and Physical Interval Note:  09/15/2015 12:24 PM  Antony ContrasShannon Pollard  has presented today for surgery, with the diagnosis of RIGHT HIP OA  The various methods of treatment have been discussed with the patient and family. After consideration of risks, benefits and other options for treatment, the patient has consented to  Procedure(s): RIGHT TOTAL HIP ARTHROPLASTY ANTERIOR APPROACH (Right) as a surgical intervention .  The patient's history has been reviewed, patient examined, no change in status, stable for surgery.  I have reviewed the patient's chart and labs.  Questions were answered to the patient's satisfaction.     Shelda PalLIN,Sarena Jezek D

## 2015-09-15 NOTE — Anesthesia Preprocedure Evaluation (Addendum)
Anesthesia Evaluation  Patient identified by MRN, date of birth, ID band Patient awake    Reviewed: Allergy & Precautions, NPO status , Patient's Chart, lab work & pertinent test results  Airway Mallampati: II  TM Distance: >3 FB Neck ROM: Full    Dental  (+) Teeth Intact   Pulmonary neg COPD, former smoker,    breath sounds clear to auscultation       Cardiovascular (-) CAD negative cardio ROS   Rhythm:Regular Rate:Normal     Neuro/Psych  Headaches, PSYCHIATRIC DISORDERS Anxiety    GI/Hepatic Neg liver ROS, GERD  Medicated and Controlled,  Endo/Other  negative endocrine ROS  Renal/GU negative Renal ROS  negative genitourinary   Musculoskeletal  (+) Arthritis ,   Abdominal   Peds negative pediatric ROS (+)  Hematology negative hematology ROS (+)   Anesthesia Other Findings   Reproductive/Obstetrics negative OB ROS                            Lab Results  Component Value Date   WBC 7.3 09/09/2015   HGB 15.5 09/09/2015   HCT 46.8 09/09/2015   MCV 93.2 09/09/2015   PLT 183 09/09/2015   Lab Results  Component Value Date   CREATININE 0.89 09/09/2015   BUN 16 09/09/2015   NA 140 09/09/2015   K 4.0 09/09/2015   CL 105 09/09/2015   CO2 25 09/09/2015   Lab Results  Component Value Date   INR 1.02 09/09/2015     Anesthesia Physical Anesthesia Plan  ASA: II  Anesthesia Plan: Spinal   Post-op Pain Management:    Induction: Intravenous  Airway Management Planned: Natural Airway and Simple Face Mask  Additional Equipment:   Intra-op Plan:   Post-operative Plan:   Informed Consent: I have reviewed the patients History and Physical, chart, labs and discussed the procedure including the risks, benefits and alternatives for the proposed anesthesia with the patient or authorized representative who has indicated his/her understanding and acceptance.   Dental advisory  given  Plan Discussed with: CRNA  Anesthesia Plan Comments:         Anesthesia Quick Evaluation

## 2015-09-16 ENCOUNTER — Encounter (HOSPITAL_COMMUNITY): Payer: Self-pay | Admitting: Orthopedic Surgery

## 2015-09-16 LAB — BASIC METABOLIC PANEL
Anion gap: 5 (ref 5–15)
BUN: 24 mg/dL — AB (ref 6–20)
CALCIUM: 8.7 mg/dL — AB (ref 8.9–10.3)
CHLORIDE: 107 mmol/L (ref 101–111)
CO2: 27 mmol/L (ref 22–32)
CREATININE: 0.79 mg/dL (ref 0.61–1.24)
GFR calc Af Amer: >60 mL/min (ref >60)
Glucose, Bld: 164 mg/dL — ABNORMAL HIGH (ref 65–99)
Potassium: 4.1 mmol/L (ref 3.5–5.1)
SODIUM: 139 mmol/L (ref 135–145)

## 2015-09-16 LAB — CBC
HCT: 34.8 % — ABNORMAL LOW (ref 39.0–52.0)
Hemoglobin: 11.4 g/dL — ABNORMAL LOW (ref 13.0–17.0)
MCH: 29.5 pg (ref 26.0–34.0)
MCHC: 32.8 g/dL (ref 30.0–36.0)
MCV: 90.2 fL (ref 78.0–100.0)
PLATELETS: 172 10*3/uL (ref 150–400)
RBC: 3.86 MIL/uL — ABNORMAL LOW (ref 4.22–5.81)
RDW: 12.6 % (ref 11.5–15.5)
WBC: 10 10*3/uL (ref 4.0–10.5)

## 2015-09-16 MED ORDER — DOCUSATE SODIUM 100 MG PO CAPS: 100.0000 mg | ORAL_CAPSULE | Freq: Two times a day (BID) | ORAL | Status: DC

## 2015-09-16 MED ORDER — METHOCARBAMOL 500 MG PO TABS: 500.0000 mg | ORAL_TABLET | Freq: Four times a day (QID) | ORAL | Status: DC | PRN

## 2015-09-16 MED ORDER — ASPIRIN 325 MG PO TBEC: 325.0000 mg | DELAYED_RELEASE_TABLET | Freq: Two times a day (BID) | ORAL | Status: AC

## 2015-09-16 MED ORDER — SODIUM CHLORIDE 0.9 % IV BOLUS (SEPSIS)
500.0000 mL | Freq: Once | INTRAVENOUS | Status: AC
  Administered 2015-09-16: 500 mL via INTRAVENOUS

## 2015-09-16 MED ORDER — HYDROCODONE-ACETAMINOPHEN 7.5-325 MG PO TABS: 1.0000 | ORAL_TABLET | ORAL | Status: DC | PRN

## 2015-09-16 MED ORDER — POLYETHYLENE GLYCOL 3350 17 G PO PACK
17.0000 g | PACK | Freq: Two times a day (BID) | ORAL | Status: DC
Start: 2015-09-16 — End: 2016-11-14

## 2015-09-16 MED ORDER — FERROUS SULFATE 325 (65 FE) MG PO TABS: 325.0000 mg | ORAL_TABLET | Freq: Three times a day (TID) | ORAL | Status: DC

## 2015-09-16 NOTE — Evaluation (Signed)
Physical Therapy Evaluation Patient Details Name: Kenneth Holt MRN: [id number] DOB: 07/02/1981 Today's Date: 09/16/2015   History of Present Illness  R THR  Clinical Impression  Pt s/p R THR presents with decreased R LE strength/ROM and post op pain limiting functional mobility.  Pt should progress to dc home with family assist and HHPT follow up.  Pt ltd this am with decreased BP during ambulation - 85/59 with HR 78 - RN aware.    Follow Up Recommendations Home health PT    Equipment Recommendations  3in1 (PT)    Recommendations for Other Services OT consult     Precautions / Restrictions Precautions Precautions: Fall Restrictions Weight Bearing Restrictions: No Other Position/Activity Restrictions: WBAT      Mobility  Bed Mobility Overal bed mobility: Needs Assistance Bed Mobility: Supine to Sit     Supine to sit: Min guard     General bed mobility comments: cues for sequence and use of L LE to self assist  Transfers Overall transfer level: Needs assistance Equipment used: Rolling walker (2 wheeled) Transfers: Sit to/from Stand Sit to Stand: Min guard         General transfer comment: cues for LE management and use of UEs to self assist  Ambulation/Gait Ambulation/Gait assistance: Min guard Ambulation Distance (Feet): 111 Feet Assistive device: Rolling walker (2 wheeled) Gait Pattern/deviations: Step-to pattern;Decreased step length - right;Decreased step length - left;Shuffle;Trunk flexed Gait velocity: decr   General Gait Details: cues for sequence, posture and position from [employer]  Stairs            Wheelchair Mobility    Modified Rankin (Stroke Patients Only)       Balance                                             Pertinent Vitals/Pain Pain Assessment: 0-10 Pain Score: 5  Pain Location: R hip/thigh Pain Descriptors / Indicators: Aching;Burning Pain Intervention(s): Limited activity within patient's  tolerance;Monitored during session;Premedicated before session;Ice applied    Home Living Family/patient expects to be discharged to:: Private residence Living Arrangements: Spouse/significant other Available Help at Discharge: Family Type of Home: House Home Access: Stairs to enter   Secretary/administratorntrance Stairs-Number of Steps: 1 Home Layout: Two level;Able to live on main level with bedroom/bathroom Home Equipment: Dan HumphreysWalker - 2 wheels;Cane - single point;Crutches      Prior Function Level of Independence: Independent               Hand Dominance        Extremity/Trunk Assessment   Upper Extremity Assessment: Overall WFL for tasks assessed           Lower Extremity Assessment: RLE deficits/detail RLE Deficits / Details: AAROM at R hip to 90 flex and 20 abd.  Strength at hip 2+/5    Cervical / Trunk Assessment: Normal  Communication   Communication: No difficulties  Cognition Arousal/Alertness: Awake/alert Behavior During Therapy: WFL for tasks assessed/performed Overall Cognitive Status: Within Functional Limits for tasks assessed                      General Comments      Exercises Total Joint Exercises Ankle Circles/Pumps: AROM;Both;15 reps;Supine Quad Sets: AROM;Both;10 reps;Supine Heel Slides: AAROM;Right;20 reps;Supine Hip ABduction/ADduction: AAROM;Right;15 reps;Supine      Assessment/Plan    PT Assessment Patient needs continued PT  services  PT Diagnosis Difficulty walking   PT Problem List Decreased strength;Decreased range of motion;Decreased activity tolerance;Decreased mobility;Decreased knowledge of use of DME;Pain  PT Treatment Interventions DME instruction;Gait training;Stair training;Functional mobility training;Therapeutic activities;Therapeutic exercise;Patient/family education   PT Goals (Current goals can be found in the Care Plan section) Acute Rehab PT Goals Patient Stated Goal: Regain IND PT Goal Formulation: With patient Time For  Goal Achievement: 09/19/15 Potential to Achieve Goals: Good    Frequency 7X/week   Barriers to discharge        Co-evaluation               End of Session Equipment Utilized During Treatment: Gait belt Activity Tolerance: Other (comment) (dizziness BP 85/59) Patient left: in chair;with call bell/phone within reach;with family/visitor present Nurse Communication: Mobility status         Time: 1610-9604 PT Time Calculation (min) (ACUTE ONLY): 32 min   Charges:   PT Evaluation $PT Eval Low Complexity: 1 Procedure PT Treatments $Therapeutic Exercise: 8-22 mins   PT G Codes:        Kenneth Holt 2015/10/13, 12:35 PM

## 2015-09-16 NOTE — Care Management Note (Addendum)
Case Management Note  Patient Details  Name: Kenneth Holt MRN: [id number] Date of Birth: 14-Oct-1980  Subjective/Objective:                  RIGHT TOTAL HIP ARTHROPLASTY ANTERIOR APPROACH (Right)  Action/Plan: Discharge planning Expected Discharge Date:  09/16/15               Expected Discharge Plan:  [place]  In-House Referral:     Discharge planning Services  CM Consult  Post Acute Care Choice:    Choice offered to:  Patient  DME Arranged:  3-N-1, Walker rolling DME Agency:  [place]:  PT [facility] Agency:  Other - See comment  Status of Service:  Completed, signed off  Medicare Important Message Given:    Date Medicare IM Given:    Medicare IM give by:    Date Additional Medicare IM Given:    Additional Medicare Important Message give by:     If discussed at [place] of Stay Meetings, dates discussed:    Additional Comments: Utilization Review complete. CM met with pt in room to offer choice of home health agency.  Pt [facility] to render HHPT.  Referral called to [phone] and request made to fax facesheet, face to face, orders, H&P, OP note and progress note to [phone].  Cm faxed requested information and received confirmation of receipt.  CM called [facility] DME rep, Lecretia to please deliver the 3n1 and rolling walker to room prior to discharge.  No other CM needs were communicated. Dellie Catholic, RN 09/16/2015, 10:32 AM

## 2015-09-16 NOTE — Progress Notes (Signed)
[facility]   [facility] is providing the following services: Walker and [employer]  If patient discharges after hours, please call [phone].   Renard HamperLecretia Holt 09/16/2015, 10:50 AM

## 2015-09-16 NOTE — Progress Notes (Signed)
Physical Therapy Treatment Patient Details Name: Kenneth Holt MRN: [id number] DOB: August 05, 1980 Today's Date: 09/16/2015    History of Present Illness R THR    PT Comments    Pt progressing well with mobility and eager for dc home.  Reviewed therex, car transfers and stair with spouse present.  Follow Up Recommendations  Home health PT     Equipment Recommendations  3in1 (PT)    Recommendations for Other Services OT consult     Precautions / Restrictions Precautions Precautions: Fall Restrictions Weight Bearing Restrictions: No Other Position/Activity Restrictions: WBAT    Mobility  Bed Mobility Overal bed mobility: Needs Assistance Bed Mobility: Supine to Sit;Sit to Supine     Supine to sit: Min guard Sit to supine: Min guard   General bed mobility comments: cues for sequence.  Pt self assisting R LE with L LE  Transfers Overall transfer level: Needs assistance Equipment used: Rolling walker (2 wheeled) Transfers: Sit to/from Stand Sit to Stand: Supervision         General transfer comment: cues for LE management and use of UEs to self assist  Ambulation/Gait Ambulation/Gait assistance: Min guard;Supervision Ambulation Distance (Feet): 120 Feet Assistive device: Rolling walker (2 wheeled) Gait Pattern/deviations: Step-to pattern;Step-through pattern;Shuffle;Trunk flexed Gait velocity: decr   General Gait Details: cues for sequence, posture and position from RW   Stairs Stairs: Yes Stairs assistance: Min assist Stair Management: No rails;Step to pattern;Backwards;With walker Number of Stairs: 2 General stair comments: single step bkwd twice with RW and cues for sequence and foot/RW placement  Wheelchair Mobility    Modified Rankin (Stroke Patients Only)       Balance                                    Cognition Arousal/Alertness: Awake/alert Behavior During Therapy: WFL for tasks assessed/performed Overall Cognitive Status:  Within Functional Limits for tasks assessed                      Exercises Total Joint Exercises Ankle Circles/Pumps: AROM;Both;15 reps;Supine Quad Sets: AROM;Both;10 reps;Supine Heel Slides: AAROM;Right;Supine;10 reps Hip ABduction/ADduction: AAROM;Right;Supine;10 reps    General Comments        Pertinent Vitals/Pain Pain Assessment: 0-10 Pain Score: 5  Pain Location: R hip Pain Descriptors / Indicators: Sore;Aching Pain Intervention(s): Limited activity within patient's tolerance;Monitored during session;Premedicated before session    Home Living Family/patient expects to be discharged to:: Private residence Living Arrangements: Spouse/significant other Available Help at Discharge: Family Type of Home: House Home Access: Stairs to enter   Home Layout: Two level;Able to live on main level with bedroom/bathroom Home Equipment: Dan Humphreys - 2 wheels;Cane - single point;Crutches;Hand held shower head      Prior Function Level of Independence: Independent          PT Goals (current goals can now be found in the care plan section) Acute Rehab PT Goals Patient Stated Goal: Regain IND PT Goal Formulation: With patient Time For Goal Achievement: 09/19/15 Potential to Achieve Goals: Good Progress towards PT goals: Progressing toward goals    Frequency  7X/week    PT Plan Current plan remains appropriate    Co-evaluation             End of Session Equipment Utilized During Treatment: Gait belt Activity Tolerance: Patient tolerated treatment well Patient left: in bed;with call bell/phone within reach;with family/visitor present  Time: 1424-1450 PT Time Calculation (min) (ACUTE ONLY): 26 min  Charges:  $Gait Training: 8-22 mins $Therapeutic Activity: 8-22 mins                    G Codes:      Quayshaun Hubbert 09/16/2015, 4:28 PM

## 2015-09-16 NOTE — Progress Notes (Signed)
Occupational Therapy Evaluation Patient Details Name: Kupono Marling MRN: [id number] DOB: 1980-11-12 Today's Date: 09/16/2015    History of Present Illness R THR   Clinical Impression   All OT education completed and pt questions answered. No further OT needs at this time. Will sign off.    Follow Up Recommendations  No OT follow up;Supervision - Intermittent    Equipment Recommendations  3 in 1 bedside comode    Recommendations for Other Services       Precautions / Restrictions Precautions Precautions: Fall Restrictions Weight Bearing Restrictions: No Other Position/Activity Restrictions: WBAT      Mobility Bed Mobility            General bed mobility comments: NT -- OOB in chair  Transfers Overall transfer level: Needs assistance Equipment used: Rolling walker (2 wheeled) Transfers: Sit to/from Stand Sit to Stand: Supervision         General transfer comment: cues for LE management and use of UEs to self assist    Balance                                            ADL Overall ADL's : Needs assistance/impaired Eating/Feeding: Independent;Sitting   Grooming: Wash/dry hands;Supervision/safety;Standing                   Toilet Transfer: Supervision/safety;Ambulation;BSC;RW;Regular Toilet   Toileting- Architect and Hygiene: Supervision/safety;Sit to/from stand       Functional mobility during ADLs: Supervision/safety;Rolling walker General ADL Comments: Patient already dressed; educated on LB dressing techniques and wife to assist at discharge. Practiced ambulation to bathroom, toilet transfer/toileting, grooming in standing. Demonstrated tub transfer technique and educated that wife needed to be present for safety. Patient declined to practice but they both verbalized understanding of information/technique. Patient returned to recliner at end of session and nurse in to give pain medications.     Vision      Perception     Praxis      Pertinent Vitals/Pain Pain Assessment: 0-10 Pain Score: 8  Pain Location: R hip/thigh Pain Descriptors / Indicators: Sore Pain Intervention(s): Limited activity within patient's tolerance;Monitored during session;Repositioned;RN gave pain meds during session     Hand Dominance Right   Extremity/Trunk Assessment Upper Extremity Assessment Upper Extremity Assessment: Overall WFL for tasks assessed   Lower Extremity Assessment Lower Extremity Assessment: Defer to PT evaluation    Cervical / Trunk Assessment Cervical / Trunk Assessment: Normal   Communication Communication Communication: No difficulties   Cognition Arousal/Alertness: Awake/alert Behavior During Therapy: WFL for tasks assessed/performed Overall Cognitive Status: Within Functional Limits for tasks assessed                     General Comments       Exercises      Shoulder Instructions      Home Living Family/patient expects to be discharged to:: Private residence Living Arrangements: Spouse/significant other Available Help at Discharge: Family Type of Home: House Home Access: Stairs to enter Secretary/administrator of Steps: 1   Home Layout: Two level;Able to live on main level with bedroom/bathroom     Bathroom Shower/Tub: Tub/shower unit Shower/tub characteristics: Engineer, building services: Standard     Home Equipment: Environmental consultant - 2 wheels;Cane - single point;Crutches;Hand held shower head          Prior Functioning/Environment Level of Independence: Independent  OT Diagnosis: Acute pain   OT Problem List: Decreased strength;Decreased range of motion;Decreased activity tolerance;Decreased knowledge of use of DME or AE;Pain   OT Treatment/Interventions:      OT Goals(Current goals can be found in the care plan section) Acute Rehab OT Goals Patient Stated Goal: Regain IND OT Goal Formulation: All assessment and education complete, DC  therapy  OT Frequency:     Barriers to D/C:            Co-evaluation              End of Session Equipment Utilized During Treatment: Rolling walker Nurse Communication: Mobility status  Activity Tolerance: Patient tolerated treatment well Patient left: in chair;with call bell/phone within reach;with chair alarm set;with family/visitor present;with nursing/sitter in room   Time: 1107-1131 OT Time Calculation (min): 24 min Charges:  OT General Charges $OT Visit: 1 Procedure OT Evaluation $OT Eval Low Complexity: 1 Procedure OT Treatments $Self Care/Home Management : 8-22 mins G-Codes:    Omya Winfield A 09/16/2015, 1:03 PM

## 2015-09-16 NOTE — Progress Notes (Signed)
     Subjective: 1 Day Post-Op Procedure(s) (LRB): RIGHT TOTAL HIP ARTHROPLASTY ANTERIOR APPROACH (Right)   Patient reports pain as mild, pain controlled. Felt better after getting up on the leg.  No events throughout the night. Ready to be discharged home.  Objective:   VITALS:   Filed Vitals:   09/16/15 0130 09/16/15 0547  BP: 103/50 104/54  Pulse: 75 87  Temp: 98.5 F (36.9 C) 98.7 F (37.1 C)  Resp: 15 14    Dorsiflexion/Plantar flexion intact Incision: dressing C/D/I No cellulitis present Compartment soft  LABS  Recent Labs  09/16/15 0413  HGB 11.4*  HCT 34.8*  WBC 10.0  PLT 172     Recent Labs  09/16/15 0413  NA 139  K 4.1  BUN 24*  CREATININE 0.79  GLUCOSE 164*     Assessment/Plan: 1 Day Post-Op Procedure(s) (LRB): RIGHT TOTAL HIP ARTHROPLASTY ANTERIOR APPROACH (Right) Foley cath d/c'ed Advance diet Up with therapy D/C IV fluids Discharge home with home health  [facility]. Follow up with OLIN,Abaigeal Moomaw D in 2 weeks.  Contact information:  [facility] [address] [place] [id number] [phone]    Overweight (BMI 25-29.9) Estimated body mass index is 29.04 kg/(m^2) as calculated from the following:   Height as of this encounter: 6\' 3"  (1.905 m).   Weight as of this encounter: 105.376 kg (232 lb 5 oz). Patient also counseled that weight may inhibit the healing process Patient counseled that losing weight will help with future health issues         Anastasio AuerbachMatthew S. Arilla Hice   PAC  09/16/2015, 9:07 AM

## 2015-09-21 ENCOUNTER — Ambulatory Visit (HOSPITAL_COMMUNITY)
Admission: RE | Admit: 2015-09-21 | Discharge: 2015-09-21 | Disposition: A | Discharge status: Home / Self Care | Payer: BLUE CROSS/BLUE SHIELD | Source: Ambulatory Visit | Attending: Cardiovascular Disease | Admitting: Cardiovascular Disease

## 2015-09-21 ENCOUNTER — Other Ambulatory Visit (HOSPITAL_COMMUNITY): Payer: Self-pay | Admitting: Orthopedic Surgery

## 2015-09-21 DIAGNOSIS — F419 Anxiety disorder, unspecified: Secondary | ICD-10-CM | POA: Diagnosis not present

## 2015-09-21 DIAGNOSIS — M79661 Pain in right lower leg: Secondary | ICD-10-CM | POA: Diagnosis not present

## 2015-09-21 DIAGNOSIS — M7989 Other specified soft tissue disorders: Secondary | ICD-10-CM | POA: Insufficient documentation

## 2015-09-21 DIAGNOSIS — K219 Gastro-esophageal reflux disease without esophagitis: Secondary | ICD-10-CM | POA: Insufficient documentation
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
## 2016-11-11 NOTE — Patient Instructions (Addendum)
Antony ContrasShannon Lahaie  11/11/2016   Your procedure is scheduled on: 11/22/2016    [facility]   Report to admitting at   115pm  Call this number if you have problems the morning of surgery  [phone]   Remember: ONLY 1 PERSON MAY GO WITH YOU TO SHORT STAY TO GET  READY MORNING OF YOUR SURGERY.  Do not eat food or drink liquids :After Midnight. May have clear liquids from 12 midnite until 0900am morning of surgery then nothing by mouth.      Take these medicines the morning of surgery with A SIP OF WATER: Zantac                                 You may not have any metal on your body including hair pins and              piercings  Do not wear jewelry,  lotions, powders or perfumes, deodorant                        Men may shave face and neck.   Do not bring valuables to the hospital. [facility] IS NOT             RESPONSIBLE   FOR VALUABLES.  Contacts, dentures or bridgework may not be worn into surgery.  Leave suitcase in the car. After surgery it may be brought to your room.                      Please read over the following fact sheets you were given: _____________________________________________________________________             [facility] Before surgery, you can play an important role.  Because skin is not sterile, your skin needs to be as free of germs as possible.  You can reduce the number of germs on your skin by washing with CHG (chlorahexidine gluconate) soap before surgery.  CHG is an antiseptic cleaner which kills germs and bonds with the skin to continue killing germs even after washing. Please DO NOT use if you have an allergy to CHG or antibacterial soaps.  If your skin becomes reddened/irritated stop using the CHG and inform your nurse when you arrive at Short Stay. Do not shave (including legs and underarms) for at least 48 hours prior to the first CHG shower.  You may shave your  face/neck. Please follow these instructions carefully:  1.  Shower with CHG Soap the night before surgery and the  morning of Surgery.  2.  If you choose to wash your hair, wash your hair first as usual with your  normal  shampoo.  3.  After you shampoo, rinse your hair and body thoroughly to remove the  shampoo.                           4.  Use CHG as you would any other liquid soap.  You can apply chg directly  to the skin and wash                       Gently with a scrungie or clean washcloth.  5.  Apply the  CHG Soap to your body ONLY FROM THE NECK DOWN.   Do not use on face/ open                           Wound or open sores. Avoid contact with eyes, ears mouth and genitals (private parts).                       Wash face,  Genitals (private parts) with your normal soap.             6.  Wash thoroughly, paying special attention to the area where your surgery  will be performed.  7.  Thoroughly rinse your body with warm water from the neck down.  8.  DO NOT shower/wash with your normal soap after using and rinsing off  the CHG Soap.                9.  Pat yourself dry with a clean towel.            10.  Wear clean pajamas.            11.  Place clean sheets on your bed the night of your first shower and do not  sleep with pets. Day of Surgery : Do not apply any lotions/deodorants the morning of surgery.  Please wear clean clothes to the hospital/surgery center.  FAILURE TO FOLLOW THESE INSTRUCTIONS MAY RESULT IN THE CANCELLATION OF YOUR SURGERY PATIENT SIGNATURE_________________________________  NURSE SIGNATURE__________________________________  ________________________________________________________________________  WHAT IS A BLOOD TRANSFUSION? Blood Transfusion Information  A transfusion is the replacement of blood or some of its parts. Blood is made up of multiple cells which provide different functions.  Red blood cells carry oxygen and are used for blood loss  replacement.  White blood cells fight against infection.  Platelets control bleeding.  Plasma helps clot blood.  Other blood products are available for specialized needs, such as hemophilia or other clotting disorders. BEFORE THE TRANSFUSION  Who gives blood for transfusions?   Healthy volunteers who are fully evaluated to make sure their blood is safe. This is blood bank blood. Transfusion therapy is the safest it has ever been in the practice of medicine. Before blood is taken from a donor, a complete history is taken to make sure that person has no history of diseases nor engages in risky social behavior (examples are intravenous drug use or sexual activity with multiple partners). The donor's travel history is screened to minimize risk of transmitting infections, such as malaria. The donated blood is tested for signs of infectious diseases, such as HIV and hepatitis. The blood is then tested to be sure it is compatible with you in order to minimize the chance of a transfusion reaction. If you or a relative donates blood, this is often done in anticipation of surgery and is not appropriate for emergency situations. It takes many days to process the donated blood. RISKS AND COMPLICATIONS Although transfusion therapy is very safe and saves many lives, the main dangers of transfusion include:   Getting an infectious disease.  Developing a transfusion reaction. This is an allergic reaction to something in the blood you were given. Every precaution is taken to prevent this. The decision to have a blood transfusion has been considered carefully by your caregiver before blood is given. Blood is not given unless the benefits outweigh the risks. AFTER THE TRANSFUSION  Right after receiving a blood transfusion,  you will usually feel much better and more energetic. This is especially true if your red blood cells have gotten low (anemic). The transfusion raises the level of the red blood cells which  carry oxygen, and this usually causes an energy increase.  The nurse administering the transfusion will monitor you carefully for complications. HOME CARE INSTRUCTIONS  No special instructions are needed after a transfusion. You may find your energy is better. Speak with your caregiver about any limitations on activity for underlying diseases you may have. SEEK MEDICAL CARE IF:   Your condition is not improving after your transfusion.  You develop redness or irritation at the intravenous (IV) site. SEEK IMMEDIATE MEDICAL CARE IF:  Any of the following symptoms occur over the next 12 hours:  Shaking chills.  You have a temperature by mouth above 102 F (38.9 C), not controlled by medicine.  Chest, back, or muscle pain.  People around you feel you are not acting correctly or are confused.  Shortness of breath or difficulty breathing.  Dizziness and fainting.  You get a rash or develop hives.  You have a decrease in urine output.  Your urine turns a dark color or changes to pink, red, or brown. Any of the following symptoms occur over the next 10 days:  You have a temperature by mouth above 102 F (38.9 C), not controlled by medicine.  Shortness of breath.  Weakness after normal activity.  The white part of the eye turns yellow (jaundice).  You have a decrease in the amount of urine or are urinating less often.  Your urine turns a dark color or changes to pink, red, or brown. Document Released: 06/17/2000 Document Revised: 09/12/2011 Document Reviewed: 02/04/2008 ExitCare Patient Information 2014 [place].  _______________________________________________________________________  Incentive Spirometer  An incentive spirometer is a tool that can help keep your lungs clear and active. This tool measures how well you are filling your lungs with each breath. Taking long deep breaths may help reverse or decrease the chance of developing breathing (pulmonary) problems  (especially infection) following:  A long period of time when you are unable to move or be active. BEFORE THE PROCEDURE   If the spirometer includes an indicator to show your best effort, your nurse or respiratory therapist will set it to a desired goal.  If possible, sit up straight or lean slightly forward. Try not to slouch.  Hold the incentive spirometer in an upright position. INSTRUCTIONS FOR USE  1. Sit on the edge of your bed if possible, or sit up as far as you can in bed or on a chair. 2. Hold the incentive spirometer in an upright position. 3. Breathe out normally. 4. Place the mouthpiece in your mouth and seal your lips tightly around it. 5. Breathe in slowly and as deeply as possible, raising the piston or the ball toward the top of the column. 6. Hold your breath for 3-5 seconds or for as long as possible. Allow the piston or ball to fall to the bottom of the column. 7. Remove the mouthpiece from your mouth and breathe out normally. 8. Rest for a few seconds and repeat Steps 1 through 7 at least 10 times every 1-2 hours when you are awake. Take your time and take a few normal breaths between deep breaths. 9. The spirometer may include an indicator to show your best effort. Use the indicator as a goal to work toward during each repetition. 10. After each set of 10 deep breaths,  practice coughing to be sure your lungs are clear. If you have an incision (the cut made at the time of surgery), support your incision when coughing by placing a pillow or rolled up towels firmly against it. Once you are able to get out of bed, walk around indoors and cough well. You may stop using the incentive spirometer when instructed by your caregiver.  RISKS AND COMPLICATIONS  Take your time so you do not get dizzy or light-headed.  If you are in pain, you may need to take or ask for pain medication before doing incentive spirometry. It is harder to take a deep breath if you are having  pain. AFTER USE  Rest and breathe slowly and easily.  It can be helpful to keep track of a log of your progress. Your caregiver can provide you with a simple table to help with this. If you are using the spirometer at home, follow these instructions: [place] IF:   You are having difficultly using the spirometer.  You have trouble using the spirometer as often as instructed.  Your pain medication is not giving enough relief while using the spirometer.  You develop fever of 100.5 F (38.1 C) or higher. SEEK IMMEDIATE MEDICAL CARE IF:   You cough up bloody sputum that had not been present before.  You develop fever of 102 F (38.9 C) or greater.  You develop worsening pain at or near the incision site. MAKE SURE YOU:   Understand these instructions.  Will watch your condition.  Will get help right away if you are not doing well or get worse. Document Released: 10/31/2006 Document Revised: 09/12/2011 Document Reviewed: 01/01/2007 ExitCare Patient Information 2014 ExitCare, [place].   ________________________________________________________________________    CLEAR LIQUID DIET   Foods Allowed                                                                     Foods Excluded  Coffee and tea, regular and decaf                             liquids that you cannot  Plain Jell-O in any flavor                                             see through such as: Fruit ices (not with fruit pulp)                                     milk, soups, orange juice  Iced Popsicles                                    All solid food Carbonated beverages, regular and diet                                    Cranberry, grape and apple  juices Sports drinks like Gatorade Lightly seasoned clear broth or consume(fat free) Sugar, honey syrup  Sample Menu Breakfast                                Lunch                                     Supper Cranberry juice                    Beef broth                             Chicken broth Jell-O                                     Grape juice                           Apple juice Coffee or tea                        Jell-O                                      Popsicle                                                Coffee or tea                        Coffee or tea  _____________________________________________________________________

## 2016-11-14 ENCOUNTER — Encounter (HOSPITAL_COMMUNITY): Payer: Self-pay

## 2016-11-14 ENCOUNTER — Encounter (HOSPITAL_COMMUNITY)
Admission: RE | Admit: 2016-11-14 | Discharge: 2016-11-14 | Disposition: A | Discharge status: Home / Self Care | Payer: BLUE CROSS/BLUE SHIELD | Source: Ambulatory Visit | Attending: Orthopedic Surgery | Admitting: Orthopedic Surgery

## 2016-11-14 DIAGNOSIS — Z01812 Encounter for preprocedural laboratory examination: Secondary | ICD-10-CM | POA: Insufficient documentation

## 2016-11-14 DIAGNOSIS — M1612 Unilateral primary osteoarthritis, left hip: Secondary | ICD-10-CM | POA: Diagnosis not present

## 2016-11-14 LAB — SURGICAL PCR SCREEN
MRSA, PCR: NEGATIVE
Staphylococcus aureus: NEGATIVE

## 2016-11-14 LAB — CBC
HEMATOCRIT: 47.1 % (ref 39.0–52.0)
Hemoglobin: 15.7 g/dL (ref 13.0–17.0)
MCH: 30.1 pg (ref 26.0–34.0)
MCHC: 33.3 g/dL (ref 30.0–36.0)
MCV: 90.2 fL (ref 78.0–100.0)
Platelets: 209 10*3/uL (ref 150–400)
RBC: 5.22 MIL/uL (ref 4.22–5.81)
RDW: 12.9 % (ref 11.5–15.5)
WBC: 7.1 10*3/uL (ref 4.0–10.5)

## 2016-11-22 ENCOUNTER — Ambulatory Visit (HOSPITAL_COMMUNITY): Payer: BLUE CROSS/BLUE SHIELD | Admitting: Anesthesiology

## 2016-11-22 ENCOUNTER — Inpatient Hospital Stay (HOSPITAL_COMMUNITY)
Admission: AD | Admit: 2016-11-22 | Discharge: 2016-11-23 | DRG: 470 | Disposition: A | Discharge status: Home / Self Care | Payer: BLUE CROSS/BLUE SHIELD | Source: Ambulatory Visit | Attending: Orthopedic Surgery | Admitting: Orthopedic Surgery

## 2016-11-22 ENCOUNTER — Encounter (HOSPITAL_COMMUNITY)
Admission: AD | Disposition: A | Discharge status: Home / Self Care | Payer: Self-pay | Source: Ambulatory Visit | Attending: Orthopedic Surgery

## 2016-11-22 ENCOUNTER — Ambulatory Visit (HOSPITAL_COMMUNITY): Payer: BLUE CROSS/BLUE SHIELD

## 2016-11-22 ENCOUNTER — Encounter (HOSPITAL_COMMUNITY): Payer: Self-pay | Admitting: Anesthesiology

## 2016-11-22 DIAGNOSIS — Z87891 Personal history of nicotine dependence: Secondary | ICD-10-CM

## 2016-11-22 DIAGNOSIS — J301 Allergic rhinitis due to pollen: Secondary | ICD-10-CM | POA: Diagnosis present

## 2016-11-22 DIAGNOSIS — K219 Gastro-esophageal reflux disease without esophagitis: Secondary | ICD-10-CM | POA: Diagnosis present

## 2016-11-22 DIAGNOSIS — M1990 Unspecified osteoarthritis, unspecified site: Secondary | ICD-10-CM | POA: Diagnosis present

## 2016-11-22 DIAGNOSIS — F419 Anxiety disorder, unspecified: Secondary | ICD-10-CM | POA: Diagnosis present

## 2016-11-22 DIAGNOSIS — E669 Obesity, unspecified: Secondary | ICD-10-CM | POA: Diagnosis present

## 2016-11-22 DIAGNOSIS — Z9181 History of falling: Secondary | ICD-10-CM | POA: Diagnosis not present

## 2016-11-22 DIAGNOSIS — Z96641 Presence of right artificial hip joint: Secondary | ICD-10-CM | POA: Diagnosis present

## 2016-11-22 DIAGNOSIS — Z6830 Body mass index (BMI) 30.0-30.9, adult: Secondary | ICD-10-CM | POA: Diagnosis not present

## 2016-11-22 DIAGNOSIS — Z96649 Presence of unspecified artificial hip joint: Secondary | ICD-10-CM

## 2016-11-22 DIAGNOSIS — M1612 Unilateral primary osteoarthritis, left hip: Principal | ICD-10-CM | POA: Diagnosis present

## 2016-11-22 DIAGNOSIS — Z96642 Presence of left artificial hip joint: Secondary | ICD-10-CM

## 2016-11-22 DIAGNOSIS — Z419 Encounter for procedure for purposes other than remedying health state, unspecified: Secondary | ICD-10-CM

## 2016-11-22 HISTORY — PX: TOTAL HIP ARTHROPLASTY: SHX124

## 2016-11-22 LAB — TYPE AND SCREEN
ABO/RH(D): A POS
ANTIBODY SCREEN: NEGATIVE

## 2016-11-22 IMAGING — DX DG HIP (WITH OR WITHOUT PELVIS) 1V PORT*L*
3 series · 3 of 3 positions shown · non-contrast
Comparison: None.

CLINICAL DATA: Status post left total hip arthroplasty. Initial
encounter.

EXAM:
DG HIP (WITH OR WITHOUT PELVIS) 1V PORT LEFT

[pelvis ap]
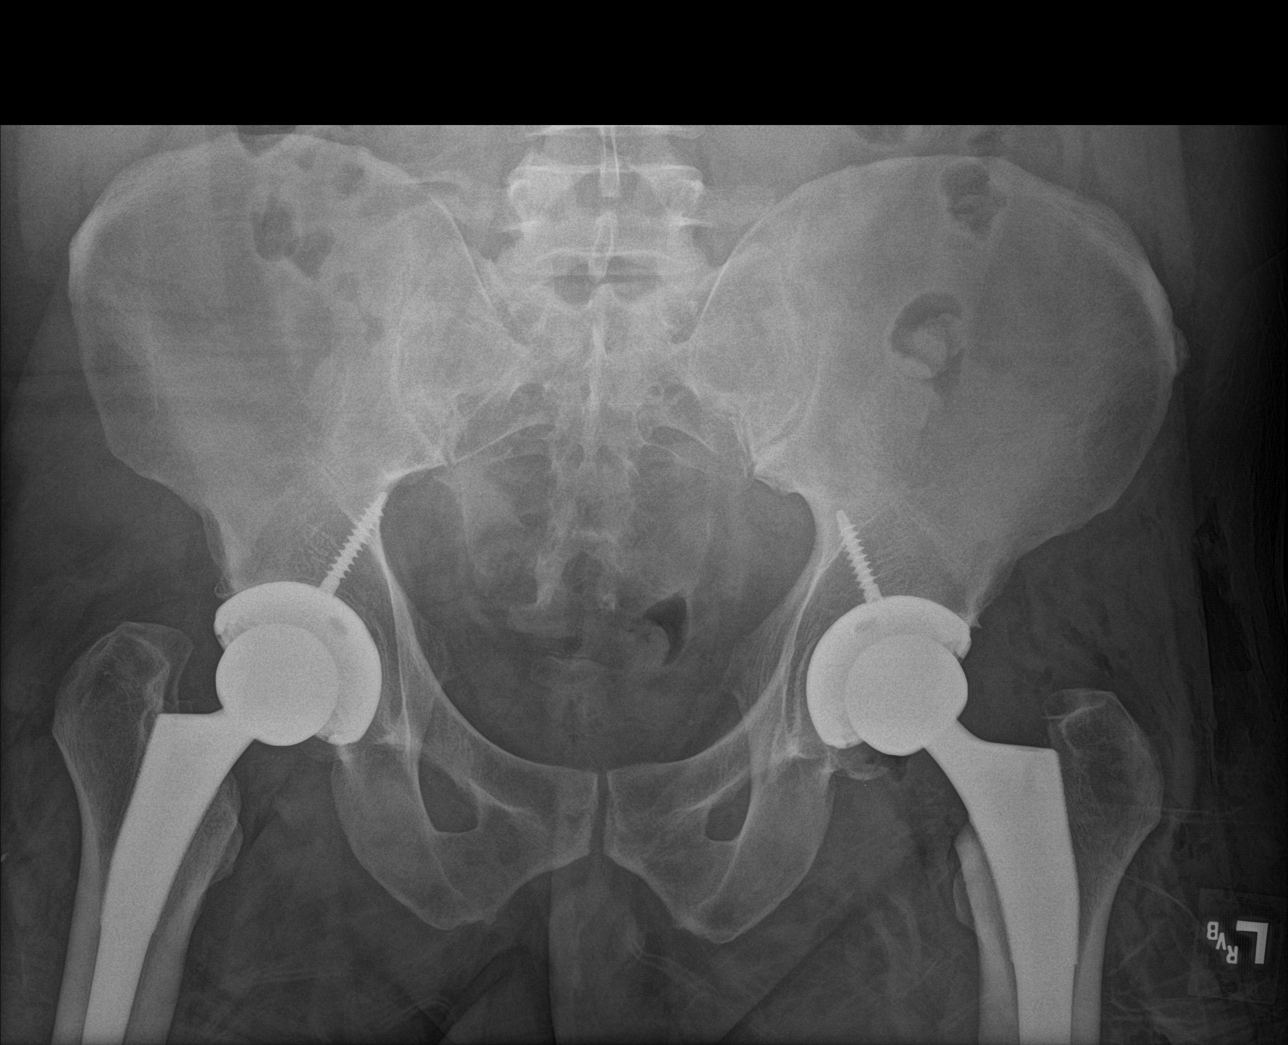

[hip ap]
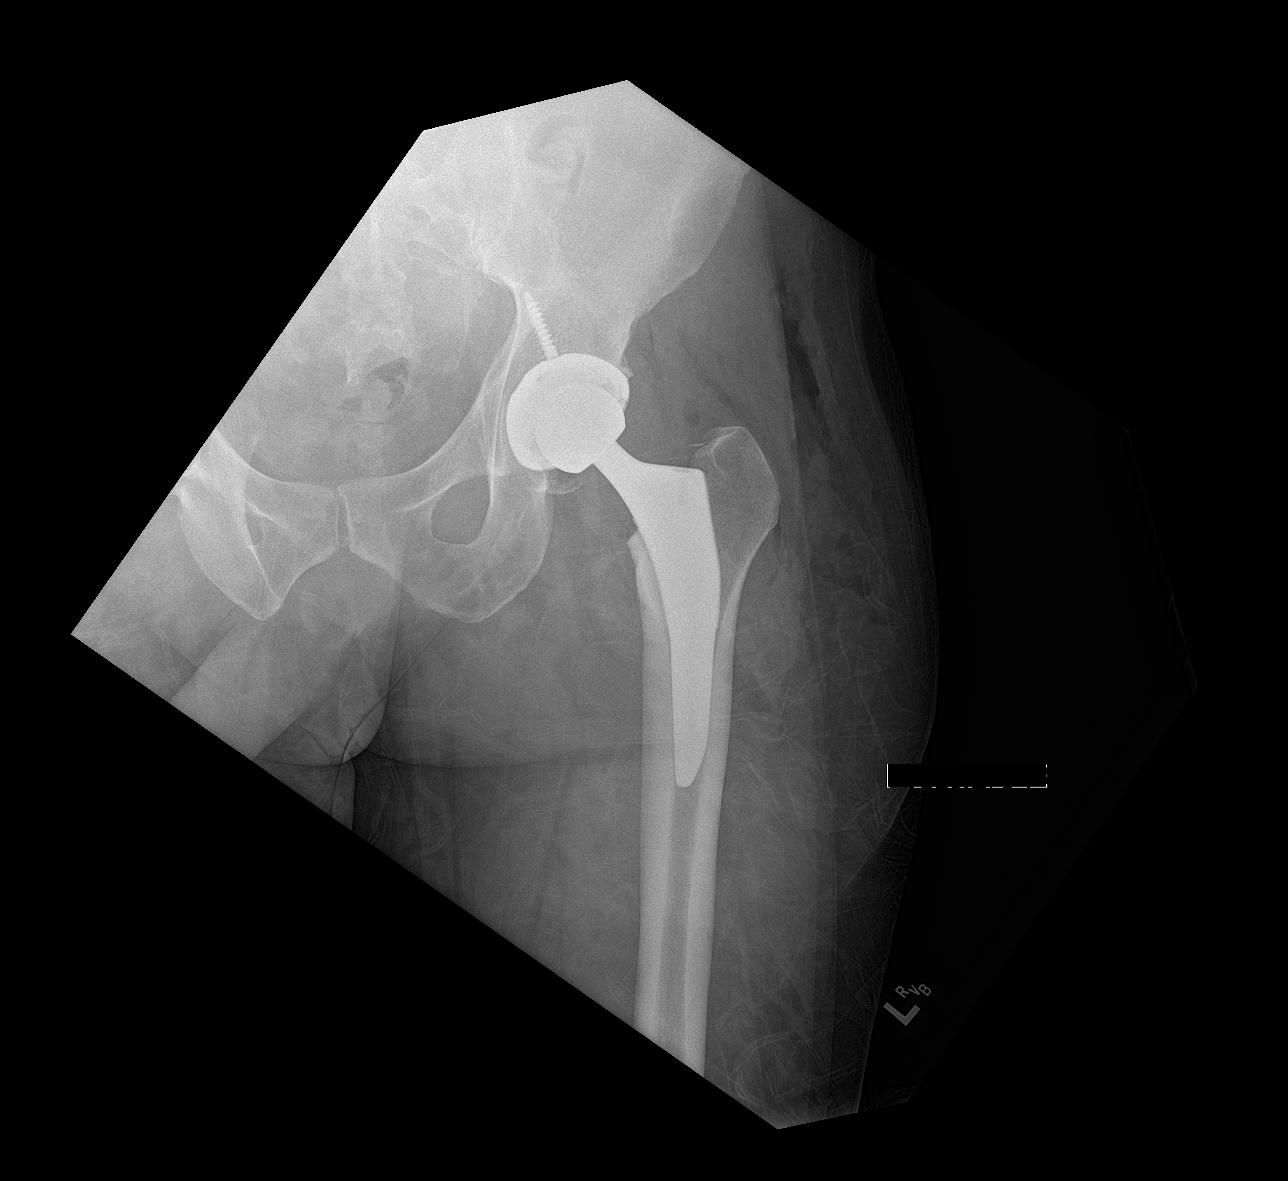

[hip frog leg]
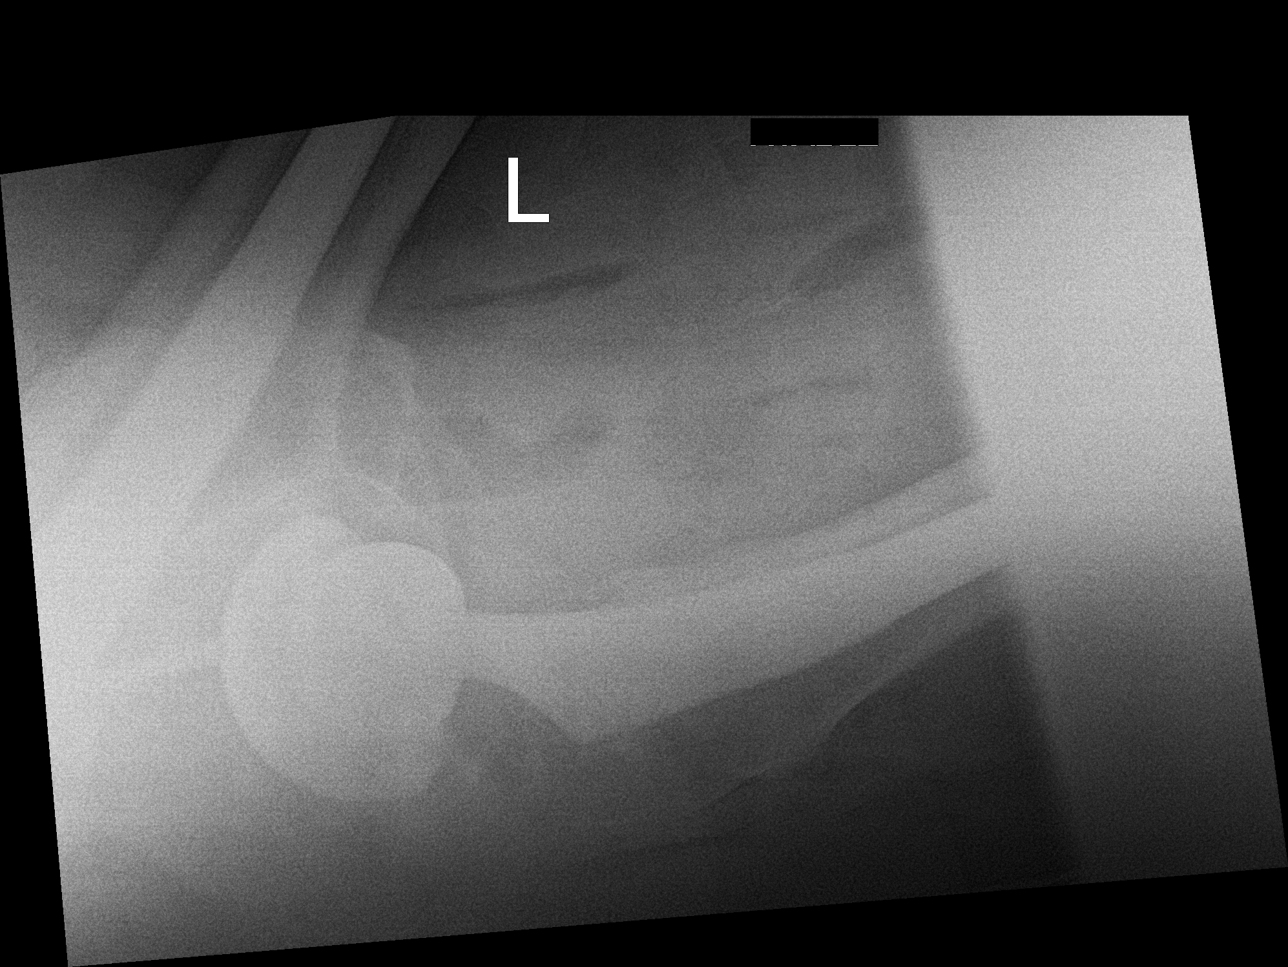

[3 of 3 positions shown; findings below may reference images not displayed]

FINDINGS: Bilateral total hip arthroplasties are grossly unremarkable in
appearance. Postoperative change is noted about the left hip. There
is no evidence of loosening or new fracture.

The visualized bowel gas pattern is grossly unremarkable. The
sacroiliac joints are within normal limits.
IMPRESSION: Bilateral hip arthroplasties are grossly unremarkable in appearance.
No evidence of loosening.

## 2016-11-22 SURGERY — ARTHROPLASTY, HIP, TOTAL, ANTERIOR APPROACH
Anesthesia: Spinal | Site: Hip | Laterality: Left

## 2016-11-22 MED ORDER — CEFAZOLIN SODIUM-DEXTROSE 2-4 GM/100ML-% IV SOLN
INTRAVENOUS | Status: AC
  Filled 2016-11-22: qty 100

## 2016-11-22 MED ORDER — PROPOFOL 10 MG/ML IV BOLUS
INTRAVENOUS | Status: DC | PRN
  Administered 2016-11-22: 50 mg via INTRAVENOUS
  Administered 2016-11-22: 20 mg via INTRAVENOUS

## 2016-11-22 MED ORDER — MIDAZOLAM HCL 5 MG/5ML IJ SOLN
INTRAMUSCULAR | Status: DC | PRN
  Administered 2016-11-22: 2 mg via INTRAVENOUS

## 2016-11-22 MED ORDER — FAMOTIDINE 20 MG PO TABS
20.0000 mg | ORAL_TABLET | Freq: Every day | ORAL | Status: DC
Start: 2016-11-23 — End: 2016-11-23
  Administered 2016-11-23: 20 mg via ORAL
  Filled 2016-11-22: qty 1

## 2016-11-22 MED ORDER — BUPIVACAINE IN DEXTROSE 0.75-8.25 % IT SOLN
INTRATHECAL | Status: DC | PRN
  Administered 2016-11-22: 2 mL via INTRATHECAL

## 2016-11-22 MED ORDER — PHENOL 1.4 % MT LIQD
1.0000 | OROMUCOSAL | Status: DC | PRN
  Filled 2016-11-22: qty 177

## 2016-11-22 MED ORDER — FENTANYL CITRATE (PF) 100 MCG/2ML IJ SOLN
INTRAMUSCULAR | Status: DC | PRN
  Administered 2016-11-22 (×2): 50 ug via INTRAVENOUS

## 2016-11-22 MED ORDER — PROPOFOL 10 MG/ML IV BOLUS
INTRAVENOUS | Status: AC
  Filled 2016-11-22: qty 60

## 2016-11-22 MED ORDER — ALUM & MAG HYDROXIDE-SIMETH 200-200-20 MG/5ML PO SUSP: 15.0000 mL | ORAL | Status: DC | PRN

## 2016-11-22 MED ORDER — PROPOFOL 10 MG/ML IV BOLUS
INTRAVENOUS | Status: AC
  Filled 2016-11-22: qty 40

## 2016-11-22 MED ORDER — RISAQUAD PO CAPS
1.0000 | ORAL_CAPSULE | Freq: Every day | ORAL | Status: DC
  Administered 2016-11-23: 1 via ORAL
  Filled 2016-11-22: qty 1

## 2016-11-22 MED ORDER — POLYETHYLENE GLYCOL 3350 17 G PO PACK: 17.0000 g | PACK | Freq: Two times a day (BID) | ORAL | 0 refills | Status: AC

## 2016-11-22 MED ORDER — MENTHOL 3 MG MT LOZG: 1.0000 | LOZENGE | OROMUCOSAL | Status: DC | PRN

## 2016-11-22 MED ORDER — FENTANYL CITRATE (PF) 100 MCG/2ML IJ SOLN
INTRAMUSCULAR | Status: AC
  Filled 2016-11-22: qty 2

## 2016-11-22 MED ORDER — TRANEXAMIC ACID 1000 MG/10ML IV SOLN
1000.0000 mg | INTRAVENOUS | Status: AC
  Administered 2016-11-22: 1000 mg via INTRAVENOUS
  Filled 2016-11-22: qty 1100

## 2016-11-22 MED ORDER — DEXAMETHASONE SODIUM PHOSPHATE 10 MG/ML IJ SOLN
INTRAMUSCULAR | Status: AC
  Filled 2016-11-22: qty 1

## 2016-11-22 MED ORDER — FERROUS SULFATE 325 (65 FE) MG PO TABS
325.0000 mg | ORAL_TABLET | Freq: Three times a day (TID) | ORAL | Status: DC
  Administered 2016-11-22: 20:00:00 325 mg via ORAL
  Filled 2016-11-22 (×2): qty 1

## 2016-11-22 MED ORDER — MAGNESIUM CITRATE PO SOLN: 1.0000 | Freq: Once | ORAL | Status: DC | PRN

## 2016-11-22 MED ORDER — BISACODYL 10 MG RE SUPP: 10.0000 mg | Freq: Every day | RECTAL | Status: DC | PRN

## 2016-11-22 MED ORDER — TRANEXAMIC ACID 1000 MG/10ML IV SOLN
1000.0000 mg | Freq: Once | INTRAVENOUS | Status: AC
  Administered 2016-11-22: 1000 mg via INTRAVENOUS
  Filled 2016-11-22: qty 1100

## 2016-11-22 MED ORDER — MIDAZOLAM HCL 2 MG/2ML IJ SOLN
INTRAMUSCULAR | Status: AC
  Filled 2016-11-22: qty 2

## 2016-11-22 MED ORDER — POLYETHYLENE GLYCOL 3350 17 G PO PACK
17.0000 g | PACK | Freq: Two times a day (BID) | ORAL | Status: DC
  Administered 2016-11-22 – 2016-11-23 (×2): 17 g via ORAL
  Filled 2016-11-22 (×2): qty 1

## 2016-11-22 MED ORDER — METHOCARBAMOL 500 MG PO TABS
500.0000 mg | ORAL_TABLET | Freq: Four times a day (QID) | ORAL | Status: DC | PRN
  Administered 2016-11-23: 500 mg via ORAL
  Filled 2016-11-22: qty 1

## 2016-11-22 MED ORDER — METHOCARBAMOL 500 MG PO TABS: 500.0000 mg | ORAL_TABLET | Freq: Four times a day (QID) | ORAL | 0 refills | Status: AC | PRN

## 2016-11-22 MED ORDER — ONDANSETRON HCL 4 MG/2ML IJ SOLN
INTRAMUSCULAR | Status: AC
  Filled 2016-11-22: qty 2

## 2016-11-22 MED ORDER — DEXAMETHASONE SODIUM PHOSPHATE 10 MG/ML IJ SOLN: 10.0000 mg | Freq: Once | INTRAMUSCULAR | Status: DC

## 2016-11-22 MED ORDER — METOCLOPRAMIDE HCL 5 MG PO TABS: 5.0000 mg | ORAL_TABLET | Freq: Three times a day (TID) | ORAL | Status: DC | PRN

## 2016-11-22 MED ORDER — DOCUSATE SODIUM 100 MG PO CAPS: 100.0000 mg | ORAL_CAPSULE | Freq: Two times a day (BID) | ORAL | 0 refills | Status: AC

## 2016-11-22 MED ORDER — DIPHENHYDRAMINE HCL 25 MG PO CAPS: 25.0000 mg | ORAL_CAPSULE | Freq: Four times a day (QID) | ORAL | Status: DC | PRN

## 2016-11-22 MED ORDER — PROPOFOL 10 MG/ML IV BOLUS
INTRAVENOUS | Status: AC
Start: 2016-11-22 — End: 2016-11-22
  Filled 2016-11-22: qty 40

## 2016-11-22 MED ORDER — ONDANSETRON HCL 4 MG/2ML IJ SOLN
4.0000 mg | Freq: Four times a day (QID) | INTRAMUSCULAR | Status: DC | PRN
  Administered 2016-11-22: 4 mg via INTRAVENOUS
  Filled 2016-11-22: qty 2

## 2016-11-22 MED ORDER — CEFAZOLIN SODIUM-DEXTROSE 2-4 GM/100ML-% IV SOLN
2.0000 g | Freq: Four times a day (QID) | INTRAVENOUS | Status: AC
  Administered 2016-11-22 – 2016-11-23 (×2): 2 g via INTRAVENOUS
  Filled 2016-11-22 (×2): qty 100

## 2016-11-22 MED ORDER — ASPIRIN 81 MG PO CHEW
81.0000 mg | CHEWABLE_TABLET | Freq: Two times a day (BID) | ORAL | Status: DC
  Administered 2016-11-22 – 2016-11-23 (×2): 81 mg via ORAL
  Filled 2016-11-22 (×2): qty 1

## 2016-11-22 MED ORDER — LACTATED RINGERS IV SOLN
INTRAVENOUS | Status: DC
  Administered 2016-11-22 (×2): via INTRAVENOUS

## 2016-11-22 MED ORDER — CELECOXIB 200 MG PO CAPS
200.0000 mg | ORAL_CAPSULE | Freq: Two times a day (BID) | ORAL | Status: DC
  Administered 2016-11-22 – 2016-11-23 (×2): 200 mg via ORAL
  Filled 2016-11-22 (×2): qty 1

## 2016-11-22 MED ORDER — HYDROMORPHONE HCL 1 MG/ML IJ SOLN
0.5000 mg | INTRAMUSCULAR | Status: DC | PRN
  Administered 2016-11-22 – 2016-11-23 (×3): 1 mg via INTRAVENOUS
  Filled 2016-11-22 (×4): qty 1

## 2016-11-22 MED ORDER — METOCLOPRAMIDE HCL 5 MG/ML IJ SOLN: 5.0000 mg | Freq: Three times a day (TID) | INTRAMUSCULAR | Status: DC | PRN

## 2016-11-22 MED ORDER — DEXAMETHASONE SODIUM PHOSPHATE 10 MG/ML IJ SOLN
10.0000 mg | Freq: Once | INTRAMUSCULAR | Status: AC
  Administered 2016-11-22: 10 mg via INTRAVENOUS

## 2016-11-22 MED ORDER — ONDANSETRON HCL 4 MG PO TABS: 4.0000 mg | ORAL_TABLET | Freq: Four times a day (QID) | ORAL | Status: DC | PRN

## 2016-11-22 MED ORDER — ASPIRIN 81 MG PO CHEW
81.0000 mg | CHEWABLE_TABLET | Freq: Two times a day (BID) | ORAL | 0 refills | Status: AC
Start: 2016-11-23 — End: 2016-12-23

## 2016-11-22 MED ORDER — DEXTROSE 5 % IV SOLN
INTRAVENOUS | Status: DC | PRN
  Administered 2016-11-22: 40 ug/min via INTRAVENOUS

## 2016-11-22 MED ORDER — PHENYLEPHRINE HCL 10 MG/ML IJ SOLN
INTRAMUSCULAR | Status: AC
  Filled 2016-11-22: qty 1

## 2016-11-22 MED ORDER — HYDROCODONE-ACETAMINOPHEN 7.5-325 MG PO TABS: 1.0000 | ORAL_TABLET | ORAL | 0 refills | Status: AC | PRN

## 2016-11-22 MED ORDER — METHOCARBAMOL 1000 MG/10ML IJ SOLN
500.0000 mg | Freq: Four times a day (QID) | INTRAVENOUS | Status: DC | PRN
  Administered 2016-11-22: 20:00:00 500 mg via INTRAVENOUS
  Filled 2016-11-22: qty 550

## 2016-11-22 MED ORDER — FERROUS SULFATE 325 (65 FE) MG PO TABS: 325.0000 mg | ORAL_TABLET | Freq: Three times a day (TID) | ORAL | Status: AC

## 2016-11-22 MED ORDER — CEFAZOLIN SODIUM-DEXTROSE 2-4 GM/100ML-% IV SOLN
2.0000 g | INTRAVENOUS | Status: AC
  Administered 2016-11-22: 2 g via INTRAVENOUS

## 2016-11-22 MED ORDER — SODIUM CHLORIDE 0.9 % IV SOLN
INTRAVENOUS | Status: DC
  Administered 2016-11-22: 18:00:00 via INTRAVENOUS

## 2016-11-22 MED ORDER — DOCUSATE SODIUM 100 MG PO CAPS
100.0000 mg | ORAL_CAPSULE | Freq: Two times a day (BID) | ORAL | Status: DC
  Administered 2016-11-22 – 2016-11-23 (×2): 100 mg via ORAL
  Filled 2016-11-22 (×2): qty 1

## 2016-11-22 MED ORDER — ONDANSETRON HCL 4 MG/2ML IJ SOLN
INTRAMUSCULAR | Status: DC | PRN
Start: 2016-11-22 — End: 2016-11-22
  Administered 2016-11-22: 4 mg via INTRAVENOUS

## 2016-11-22 MED ORDER — HYDROCODONE-ACETAMINOPHEN 7.5-325 MG PO TABS
1.0000 | ORAL_TABLET | ORAL | Status: DC
  Administered 2016-11-22: 20:00:00 1 via ORAL
  Administered 2016-11-23 (×4): 2 via ORAL
  Filled 2016-11-22 (×2): qty 2
  Filled 2016-11-22: qty 1
  Filled 2016-11-22 (×2): qty 2

## 2016-11-22 MED ORDER — PROPOFOL 500 MG/50ML IV EMUL
INTRAVENOUS | Status: DC | PRN
  Administered 2016-11-22: 100 ug/kg/min via INTRAVENOUS

## 2016-11-22 MED ORDER — CHLORHEXIDINE GLUCONATE 4 % EX LIQD: 60.0000 mL | Freq: Once | CUTANEOUS | Status: DC

## 2016-11-22 MED ORDER — STERILE WATER FOR IRRIGATION IR SOLN
Status: DC | PRN
  Administered 2016-11-22: 2000 mL

## 2016-11-22 MED ORDER — SODIUM CHLORIDE 0.9 % IR SOLN
Status: DC | PRN
  Administered 2016-11-22: 1000 mL

## 2016-11-22 SURGICAL SUPPLY — 37 items
BAG ZIPLOCK 12X15 (MISCELLANEOUS) ×3 IMPLANT
BLADE SAG 18X100X1.27 (BLADE) ×3 IMPLANT
CAPT HIP TOTAL 2 ×3 IMPLANT
CLOTH BEACON ORANGE TIMEOUT ST (SAFETY) ×3 IMPLANT
COVER PERINEAL POST (MISCELLANEOUS) ×3 IMPLANT
COVER SURGICAL LIGHT HANDLE (MISCELLANEOUS) ×3 IMPLANT
DERMABOND ADVANCED (GAUZE/BANDAGES/DRESSINGS) ×2
DERMABOND ADVANCED .7 DNX12 (GAUZE/BANDAGES/DRESSINGS) ×1 IMPLANT
DRAPE STERI IOBAN 125X83 (DRAPES) ×3 IMPLANT
DRAPE U-SHAPE 47X51 STRL (DRAPES) ×6 IMPLANT
DRESSING AQUACEL AG SP 3.5X10 (GAUZE/BANDAGES/DRESSINGS) ×1 IMPLANT
DRSG AQUACEL AG SP 3.5X10 (GAUZE/BANDAGES/DRESSINGS) ×3
DURAPREP 26ML APPLICATOR (WOUND CARE) ×3 IMPLANT
ELECT REM PT RETURN 15FT ADLT (MISCELLANEOUS) ×3 IMPLANT
GLOVE BIOGEL PI IND STRL 7.5 (GLOVE) ×7 IMPLANT
GLOVE BIOGEL PI IND STRL 8.5 (GLOVE) ×1 IMPLANT
GLOVE BIOGEL PI INDICATOR 7.5 (GLOVE) ×14
GLOVE BIOGEL PI INDICATOR 8.5 (GLOVE) ×2
GLOVE ECLIPSE 6.5 STRL STRAW (GLOVE) ×3 IMPLANT
GLOVE ECLIPSE 8.0 STRL XLNG CF (GLOVE) ×6 IMPLANT
GLOVE INDICATOR 6.5 STRL GRN (GLOVE) ×3 IMPLANT
GLOVE ORTHO TXT STRL SZ7.5 (GLOVE) ×3 IMPLANT
GLOVE SURG SS PI 7.5 STRL IVOR (GLOVE) ×3 IMPLANT
GOWN STRL REUS W/ TWL XL LVL3 (GOWN DISPOSABLE) ×1 IMPLANT
GOWN STRL REUS W/TWL LRG LVL3 (GOWN DISPOSABLE) ×3 IMPLANT
GOWN STRL REUS W/TWL XL LVL3 (GOWN DISPOSABLE) ×8 IMPLANT
HOLDER FOLEY CATH W/STRAP (MISCELLANEOUS) ×3 IMPLANT
PACK ANTERIOR HIP CUSTOM (KITS) ×3 IMPLANT
SUT MNCRL AB 4-0 PS2 18 (SUTURE) ×3 IMPLANT
SUT STRATAFIX 0 PDS 27 VIOLET (SUTURE) ×3
SUT VIC AB 1 CT1 36 (SUTURE) ×9 IMPLANT
SUT VIC AB 2-0 CT1 27 (SUTURE) ×4
SUT VIC AB 2-0 CT1 TAPERPNT 27 (SUTURE) ×2 IMPLANT
SUTURE STRATFX 0 PDS 27 VIOLET (SUTURE) ×1 IMPLANT
TRAY FOLEY CATH SILVER 14FR (SET/KITS/TRAYS/PACK) ×3 IMPLANT
TRAY FOLEY W/METER SILVER 16FR (SET/KITS/TRAYS/PACK) IMPLANT
YANKAUER SUCT BULB TIP 10FT TU (MISCELLANEOUS) ×3 IMPLANT

## 2016-11-22 NOTE — Transfer of Care (Signed)
Immediate Anesthesia Transfer of Care Note  Patient: Kenneth Holt  Procedure(s) Performed: Procedure(s): LEFT TOTAL HIP ARTHROPLASTY ANTERIOR APPROACH (Left)  Patient Location: PACU  Anesthesia Type:Spinal  Level of Consciousness:  sedated, patient cooperative and responds to stimulation  Airway & Oxygen Therapy:Patient Spontanous Breathing and Patient connected to face mask oxgen  Post-op Assessment:  Report given to PACU RN and Post -op Vital signs reviewed and stable  Post vital signs:  Reviewed and stable  Last Vitals:  Vitals:   11/22/16 1314  BP: 138/87  Pulse: (!) 104  Resp: 18  Temp: 36.8 C    Complications: No apparent anesthesia complications

## 2016-11-22 NOTE — Anesthesia Preprocedure Evaluation (Signed)
Anesthesia Evaluation  Patient identified by MRN, date of birth, ID band Patient awake    Reviewed: Allergy & Precautions, NPO status , Patient's Chart, lab work & pertinent test results  Airway Mallampati: II  TM Distance: >3 FB Neck ROM: Full    Dental  (+) Teeth Intact   Pulmonary neg COPD, former smoker,    breath sounds clear to auscultation       Cardiovascular (-) CAD negative cardio ROS   Rhythm:Regular Rate:Normal     Neuro/Psych  Headaches, PSYCHIATRIC DISORDERS Anxiety    GI/Hepatic Neg liver ROS, GERD  Medicated and Controlled,  Endo/Other  negative endocrine ROS  Renal/GU negative Renal ROS  negative genitourinary   Musculoskeletal  (+) Arthritis , Osteoarthritis,    Abdominal   Peds negative pediatric ROS (+)  Hematology negative hematology ROS (+)   Anesthesia Other Findings   Reproductive/Obstetrics negative OB ROS                             Lab Results  Component Value Date   WBC 7.1 11/14/2016   HGB 15.7 11/14/2016   HCT 47.1 11/14/2016   MCV 90.2 11/14/2016   PLT 209 11/14/2016     Anesthesia Physical  Anesthesia Plan  ASA: II  Anesthesia Plan: Spinal   Post-op Pain Management:    Induction: Intravenous  Airway Management Planned: Natural Airway and Simple Face Mask  Additional Equipment:   Intra-op Plan:   Post-operative Plan:   Informed Consent: I have reviewed the patients History and Physical, chart, labs and discussed the procedure including the risks, benefits and alternatives for the proposed anesthesia with the patient or authorized representative who has indicated his/her understanding and acceptance.   Dental advisory given  Plan Discussed with: CRNA  Anesthesia Plan Comments:         Anesthesia Quick Evaluation

## 2016-11-22 NOTE — Discharge Instructions (Signed)

## 2016-11-22 NOTE — Interval H&P Note (Signed)
History and Physical Interval Note:  11/22/2016 1:30 PM  Antony ContrasShannon Uhlig  has presented today for surgery, with the diagnosis of Left hip osteoarthritis  The various methods of treatment have been discussed with the patient and family. After consideration of risks, benefits and other options for treatment, the patient has consented to  Procedure(s): LEFT TOTAL HIP ARTHROPLASTY ANTERIOR APPROACH (Left) as a surgical intervention .  The patient's history has been reviewed, patient examined, no change in status, stable for surgery.  I have reviewed the patient's chart and labs.  Questions were answered to the patient's satisfaction.     Shelda PalLIN,Lunette Tapp D

## 2016-11-22 NOTE — Op Note (Signed)
NAME:  Kenneth Holt                ACCOUNT NO.: [id number]      MEDICAL RECORD NO.: [id number]      FACILITY[facility]  [facility]      PHYSICIAN:  Durene Romans D  DATE OF BIRTH:  [birth year]     DATE OF PROCEDURE:  11/22/2016                                 OPERATIVE REPORT         PREOPERATIVE DIAGNOSIS: Left  hip osteoarthritis.      POSTOPERATIVE DIAGNOSIS:  Left hip osteoarthritis.      PROCEDURE:  Left total hip replacement through an anterior approach   utilizing DePuy THR system, component size 54mm pinnacle cup, a size 36+4 neutral   Altrex liner, a size 6 Hi Tri Lock stem with a 36+5 delta ceramic   ball.      SURGEON:  Madlyn Frankel. Charlann Boxer, M.D.      ASSISTANT:  Lanney Gins, PA-C     ANESTHESIA:  Spinal.      SPECIMENS:  None.      COMPLICATIONS:  None.      BLOOD LOSS:  600 cc     DRAINS:  None.      INDICATION OF THE PROCEDURE:  Kenneth Holt is a 36 y.o. male who had   presented to office for evaluation of left hip pain.  History of already well performing right total hip replacement.  Now with significantly activity limiting pain due to his left hip.  Radiographs revealed   progressive degenerative changes with bone-on-bone   articulation to the  hip joint.  The patient had painful limited range of   motion significantly affecting their overall quality of life.  The patient was failing to    respond to conservative measures, and at this point was ready   to proceed with more definitive measures.  The patient has noted progressive   degenerative changes in his hip, progressive problems and dysfunction   with regarding the hip prior to surgery.  Consent was obtained for   benefit of pain relief.  Specific risk of infection, DVT, component   failure, dislocation, need for revision surgery, as well discussion of   the anterior versus posterior approach were reviewed.  Consent was   obtained for benefit of anterior pain relief through an anterior    approach.      PROCEDURE IN DETAIL:  The patient was brought to operative theater.   Once adequate anesthesia, preoperative antibiotics, 2 gm of Ancef, 1 gm of Tranexamic Acid, and 10 mg of Decadron administered.   The patient was positioned supine on the OSI Hanna table.  Once adequate   padding of boney process was carried out, we had predraped out the hip, and  used fluoroscopy to confirm orientation of the pelvis and position.      The left hip was then prepped and draped from proximal iliac crest to   mid thigh with shower curtain technique.      Time-out was performed identifying the patient, planned procedure, and   extremity.     An incision was then made 2 cm distal and lateral to the   anterior superior iliac spine extending over the orientation of the   tensor fascia lata muscle and sharp dissection was carried down to the  fascia of the muscle and protractor placed in the soft tissues.      The fascia was then incised.  The muscle belly was identified and swept   laterally and retractor placed along the superior neck.  Following   cauterization of the circumflex vessels and removing some pericapsular   fat, a second cobra retractor was placed on the inferior neck.  A third   retractor was placed on the anterior acetabulum after elevating the   anterior rectus.  A L-capsulotomy was along the line of the   superior neck to the trochanteric fossa, then extended proximally and   distally.  Tag sutures were placed and the retractors were then placed   intracapsular.  We then identified the trochanteric fossa and   orientation of my neck cut, confirmed this radiographically   and then made a neck osteotomy with the femur on traction.  The femoral   head was removed without difficulty or complication.  Traction was let   off and retractors were placed posterior and anterior around the   acetabulum.      The labrum and foveal tissue were debrided.  I began reaming with a 46mm    reamer and reamed up to 53mm reamer with good bony bed preparation and a 54mm   cup was chosen.  The final 54mm Pinnacle cup was then impacted under fluoroscopy  to confirm the depth of penetration and orientation with respect to   abduction.  A screw was placed followed by the hole eliminator.  The final   36+4 neutral Altrex liner was impacted with good visualized rim fit.  The cup was positioned anatomically within the acetabular portion of the pelvis.      At this point, the femur was rolled at 80 degrees.  Further capsule was   released off the inferior aspect of the femoral neck.  I then   released the superior capsule proximally.  The hook was placed laterally   along the femur and elevated manually and held in position with the bed   hook.  The leg was then extended and adducted with the leg rolled to 100   degrees of external rotation.  Once the proximal femur was fully   exposed, I used a box osteotome to set orientation.  I then began   broaching with the starting chili pepper broach and passed this by hand and then broached up to 6.  With the 6 broach in place I chose a high offset neck and did several trial reductions.  The offset was appropriate, leg lengths   appeared to be equal best matched to the other hip with the +5 head ball confirmed radiographically.   Given these findings, I went ahead and dislocated the hip, repositioned all   retractors and positioned the right hip in the extended and abducted position.  The final 6 Hi Tri Lock stem was   chosen and it was impacted down to the level of neck cut.  Based on this   and the trial reduction, a 36+5 delta ceramic ball was chosen and   impacted onto a clean and dry trunnion, and the hip was reduced.  The   hip had been irrigated throughout the case again at this point.  I did   reapproximate the superior capsular leaflet to the anterior leaflet   using #1 Vicryl.  The fascia of the   tensor fascia lata muscle was then  reapproximated using #1 Vicryl.  The  remaining wound was closed with 2-0 Vicryl and running 4-0 Monocryl.   The hip was cleaned, dried, and dressed sterilely using Dermabond and   Aquacel dressing.  He was then brought   to recovery room in stable condition tolerating the procedure well.    Lanney Gins, PA-C was present for the entirety of the case involved from   preoperative positioning, perioperative retractor management, general   facilitation of the case, as well as primary wound closure as assistant.            Madlyn Frankel Charlann Boxer, M.D.        11/22/2016 4:54 PM

## 2016-11-22 NOTE — Anesthesia Postprocedure Evaluation (Addendum)
Anesthesia Post Note  Patient: Kenneth Holt  Procedure(s) Performed: Procedure(s) (LRB): LEFT TOTAL HIP ARTHROPLASTY ANTERIOR APPROACH (Left)  Patient location during evaluation: PACU Anesthesia Type: Spinal Level of consciousness: oriented and awake and alert Pain management: pain level controlled Vital Signs Assessment: post-procedure vital signs reviewed and stable Respiratory status: spontaneous breathing, respiratory function stable and patient connected to nasal cannula oxygen Cardiovascular status: blood pressure returned to baseline and stable Postop Assessment: no headache, no backache, spinal receding and patient able to bend at knees Anesthetic complications: no       Last Vitals:  Vitals:   11/22/16 1830 11/22/16 1922  BP: (!) 122/93 122/71  Pulse: 96 (!) 101  Resp: 12 14  Temp: 36.7 C 36.8 C    Last Pain:  Vitals:   11/22/16 1922  TempSrc: Oral  PainSc:                  Shelton SilvasKevin D Anaissa Macfadden

## 2016-11-22 NOTE — Anesthesia Procedure Notes (Signed)
Spinal  Patient location during procedure: OR Start time: 11/22/2016 3:22 PM End time: 11/22/2016 3:26 PM Reason for block: at surgeon's request Staffing Resident/CRNA: Anne Fu Performed: resident/CRNA  Preanesthetic Checklist Completed: patient identified, site marked, surgical consent, pre-op evaluation, timeout performed, IV checked, risks and benefits discussed and monitors and equipment checked Spinal Block Patient position: sitting Prep: DuraPrep Patient monitoring: heart rate, continuous pulse ox and blood pressure Approach: right paramedian Location: L2-3 Injection technique: single-shot Needle Needle type: Pencan  Needle gauge: 24 G Needle length: 9 cm Assessment Sensory level: T6 Additional Notes Expiration date of kit checked and confirmed. Patient tolerated procedure well, without complications. X 1 attempt with noted clear CSF return. Loss of motor and sensory on exam post injection. Right Paramedian used to avoid tattoo on patients back, site had no ink present at injection site.

## 2016-11-23 ENCOUNTER — Encounter (HOSPITAL_COMMUNITY): Payer: Self-pay | Admitting: Orthopedic Surgery

## 2016-11-23 DIAGNOSIS — E669 Obesity, unspecified: Secondary | ICD-10-CM | POA: Diagnosis present

## 2016-11-23 LAB — CBC
HEMATOCRIT: 42.5 % (ref 39.0–52.0)
HEMOGLOBIN: 14 g/dL (ref 13.0–17.0)
MCH: 29.3 pg (ref 26.0–34.0)
MCHC: 32.9 g/dL (ref 30.0–36.0)
MCV: 88.9 fL (ref 78.0–100.0)
Platelets: 191 10*3/uL (ref 150–400)
RBC: 4.78 MIL/uL (ref 4.22–5.81)
RDW: 12.5 % (ref 11.5–15.5)
WBC: 12.8 10*3/uL — AB (ref 4.0–10.5)

## 2016-11-23 LAB — BASIC METABOLIC PANEL
ANION GAP: 8 (ref 5–15)
BUN: 19 mg/dL (ref 6–20)
CALCIUM: 9 mg/dL (ref 8.9–10.3)
CHLORIDE: 104 mmol/L (ref 101–111)
CO2: 25 mmol/L (ref 22–32)
Creatinine, Ser: 0.94 mg/dL (ref 0.61–1.24)
GFR calc non Af Amer: >60 mL/min (ref >60)
GLUCOSE: 183 mg/dL — AB (ref 65–99)
Potassium: 4.1 mmol/L (ref 3.5–5.1)
Sodium: 137 mmol/L (ref 135–145)

## 2016-11-23 NOTE — Progress Notes (Signed)
Pt to d/c home. No DME or home PT needed. AVS reviewed and "My Chart" discussed with pt. Pt capable of verbalizing medications, signs and symptoms of infection, and follow-up appointments. Remains hemodynamically stable. No signs and symptoms of distress. Educated pt to return to ER in the case of SOB, dizziness, or chest pain.
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
## 2016-11-23 NOTE — Progress Notes (Signed)
OT Note  Patient Details Name: Kenneth Holt MRN: [id number] DOB: 06/06/1981   Cancelled Treatment:    Reason Eval/Treat Not Completed: Other (comment)  Briefly spoke to patient regarding strategies to perform ADL activity . No further OT needed.  Dorena BodoREDDING, Dina Warbington D  Lori TukwilaRedding, [place] [phone] 11/23/2016, 11:05 AM
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
## 2016-11-23 NOTE — Progress Notes (Signed)
     Subjective: 1 Day Post-Op Procedure(s) (LRB): LEFT TOTAL HIP ARTHROPLASTY ANTERIOR APPROACH (Left)   Patient reports pain as mild, pain controlled. No events throughout the night. Had some pain this morning, but now controlled. Ready to be discharged home.   Objective:   VITALS:   Vitals:   11/23/16 0630 11/23/16 0700  BP:    Pulse: (!) 112 (!) 108  Resp: 16   Temp:      Dorsiflexion/Plantar flexion intact Incision: dressing C/D/I No cellulitis present Compartment soft  LABS  Recent Labs  11/23/16 0450  HGB 14.0  HCT 42.5  WBC 12.8*  PLT 191     Recent Labs  11/23/16 0450  NA 137  K 4.1  BUN 19  CREATININE 0.94  GLUCOSE 183*     Assessment/Plan: 1 Day Post-Op Procedure(s) (LRB): LEFT TOTAL HIP ARTHROPLASTY ANTERIOR APPROACH (Left) Foley cath d/c'ed Advance diet Up with therapy D/C IV fluids Discharge home [facility]. Follow up with OLIN,Edmonia Gonser D in 2 weeks.  Contact information:  [facility] [address], [address] [place] [id number] [phone]    Obese (BMI 30-39.9) Estimated body mass index is 30.62 kg/m as calculated from the following:   Height as of this encounter: 6\' 3"  (1.905 m).   Weight as of this encounter: 111.1 kg (245 lb). Patient also counseled that weight may inhibit the healing process Patient counseled that losing weight will help with future health issues      Anastasio AuerbachMatthew S. Mico Spark   PAC  11/23/2016, 8:00 AM

## 2016-11-23 NOTE — Progress Notes (Signed)
Spoke with patient at bedside. D/c plans for no PT, patient has equipment from previous surgery. No further HH needs assessed. Anticipate d/c later today. [phone]
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
## 2016-12-02 NOTE — Addendum Note (Signed)
Addendum  created 12/02/16 1250 by Hollis, Kevin D, MD   Sign clinical note
